# Patient Record
Sex: Female | Born: 1950
Health system: Southern US, Community
[De-identification: ages and names within clinical notes are randomized; demographics above are authoritative.]

## PROBLEM LIST (undated history)

## (undated) DIAGNOSIS — I480 Paroxysmal atrial fibrillation: Secondary | ICD-10-CM

## (undated) DIAGNOSIS — I10 Essential (primary) hypertension: Secondary | ICD-10-CM

## (undated) DIAGNOSIS — I350 Nonrheumatic aortic (valve) stenosis: Secondary | ICD-10-CM

## (undated) DIAGNOSIS — E119 Type 2 diabetes mellitus without complications: Secondary | ICD-10-CM

## (undated) DIAGNOSIS — I517 Cardiomegaly: Secondary | ICD-10-CM

## (undated) DIAGNOSIS — J449 Chronic obstructive pulmonary disease, unspecified: Secondary | ICD-10-CM

## (undated) DIAGNOSIS — I739 Peripheral vascular disease, unspecified: Secondary | ICD-10-CM

## (undated) DIAGNOSIS — I509 Heart failure, unspecified: Secondary | ICD-10-CM

## (undated) HISTORY — DX: Peripheral vascular disease, unspecified: I73.9

## (undated) HISTORY — PX: TUBAL LIGATION: SHX77

## (undated) HISTORY — DX: Paroxysmal atrial fibrillation: I48.0

## (undated) HISTORY — DX: Heart failure, unspecified: I50.9

## (undated) HISTORY — DX: Chronic obstructive pulmonary disease, unspecified: J44.9

## (undated) HISTORY — DX: Type 2 diabetes mellitus without complications: E11.9

## (undated) HISTORY — DX: Essential (primary) hypertension: I10

## (undated) HISTORY — DX: Nonrheumatic aortic (valve) stenosis: I35.0

## (undated) HISTORY — PX: TONSILLECTOMY: SUR1361

## (undated) HISTORY — DX: Cardiomegaly: I51.7

---

## 2015-06-24 DIAGNOSIS — F17200 Nicotine dependence, unspecified, uncomplicated: Secondary | ICD-10-CM | POA: Insufficient documentation

## 2015-06-24 DIAGNOSIS — IMO0001 Reserved for inherently not codable concepts without codable children: Secondary | ICD-10-CM

## 2015-06-24 DIAGNOSIS — E119 Type 2 diabetes mellitus without complications: Secondary | ICD-10-CM

## 2015-06-24 DIAGNOSIS — I4892 Unspecified atrial flutter: Secondary | ICD-10-CM | POA: Insufficient documentation

## 2015-06-24 DIAGNOSIS — I10 Essential (primary) hypertension: Secondary | ICD-10-CM | POA: Insufficient documentation

## 2015-06-24 DIAGNOSIS — I480 Paroxysmal atrial fibrillation: Secondary | ICD-10-CM | POA: Insufficient documentation

## 2015-06-24 HISTORY — DX: Essential (primary) hypertension: I10

## 2015-06-24 HISTORY — DX: Type 2 diabetes mellitus without complications: E11.9

## 2015-06-24 HISTORY — DX: Nicotine dependence, unspecified, uncomplicated: F17.200

## 2015-06-24 HISTORY — DX: Reserved for inherently not codable concepts without codable children: IMO0001

## 2015-06-24 HISTORY — DX: Unspecified atrial flutter: I48.92

## 2015-07-24 ENCOUNTER — Encounter: Payer: Self-pay | Admitting: Cardiology

## 2015-12-16 DIAGNOSIS — I739 Peripheral vascular disease, unspecified: Secondary | ICD-10-CM | POA: Insufficient documentation

## 2016-03-07 DIAGNOSIS — I350 Nonrheumatic aortic (valve) stenosis: Secondary | ICD-10-CM

## 2016-03-07 HISTORY — DX: Nonrheumatic aortic (valve) stenosis: I35.0

## 2017-04-26 ENCOUNTER — Ambulatory Visit (INDEPENDENT_AMBULATORY_CARE_PROVIDER_SITE_OTHER): Payer: BLUE CROSS/BLUE SHIELD | Admitting: Cardiology

## 2017-04-26 ENCOUNTER — Encounter: Payer: Self-pay | Admitting: Cardiology

## 2017-04-26 VITALS — BP 110/68 | HR 76 | Resp 14 | Ht 61.75 in | Wt 204.8 lb

## 2017-04-26 DIAGNOSIS — I739 Peripheral vascular disease, unspecified: Secondary | ICD-10-CM | POA: Diagnosis not present

## 2017-04-26 DIAGNOSIS — F17200 Nicotine dependence, unspecified, uncomplicated: Secondary | ICD-10-CM

## 2017-04-26 DIAGNOSIS — I350 Nonrheumatic aortic (valve) stenosis: Secondary | ICD-10-CM

## 2017-04-26 DIAGNOSIS — E119 Type 2 diabetes mellitus without complications: Secondary | ICD-10-CM

## 2017-04-26 DIAGNOSIS — I480 Paroxysmal atrial fibrillation: Secondary | ICD-10-CM

## 2017-04-26 DIAGNOSIS — I10 Essential (primary) hypertension: Secondary | ICD-10-CM | POA: Diagnosis not present

## 2017-04-26 DIAGNOSIS — IMO0001 Reserved for inherently not codable concepts without codable children: Secondary | ICD-10-CM

## 2017-04-26 NOTE — Progress Notes (Signed)
Cardiology Office Note:    Date:  04/26/2017   ID:  Makayla Leach, Makayla Leach [birth year], MRN [id number]  PCP:  Lise Auer, MD  Cardiologist:  Gypsy Balsam, MD    Referring MD: No ref. provider found   Chief Complaint  Patient presents with  . Follow-up  Paroxysmal mitral fibrillation  History of Present Illness:    Makayla Leach is a 66 y.o. female  with paroxysmal mitral fibrillation. She described some episodes keep beats but no sustained arrhythmia. Last evening she forgot to take flecainide and she felt some more palpitations. Today she took it the way she should and everything quiet down. Still does not want to take anticoagulation. Her CHADS2Vas score is equal 2. She does have exertional shortness of breath but no chest pain tightness squeezing pressure burning chest. No TIA/CVA symptoms.  Past Medical History:  Diagnosis Date  . Aortic stenosis   . CHF (congestive heart failure) (HCC)   . COPD (chronic obstructive pulmonary disease) (HCC)   . Diabetes mellitus without complication (HCC)   . Hypertension   . LAE (left atrial enlargement)   . Paroxysmal atrial fibrillation (HCC)   . Peripheral vascular disease [facility])     Past Surgical History:  Procedure Laterality Date  . TONSILLECTOMY    . TUBAL LIGATION      Current Medications: Current Meds  Medication Sig  . albuterol (ACCUNEB) 0.63 MG/3ML nebulizer solution Take 1 ampule by nebulization every 6 (six) hours as needed for wheezing.  Marland Kitchen aspirin EC 81 MG tablet Take 162 mg by mouth daily.  Marland Kitchen Co-Enzyme Q-10 30 MG CAPS Take 30 mg by mouth daily.   . empagliflozin (JARDIANCE) 25 MG TABS tablet Take 12.5 mg by mouth daily.  . flecainide (TAMBOCOR) 50 MG tablet Take 1 tablet by mouth 2 (two) times daily.  . metoprolol succinate (TOPROL-XL) 25 MG 24 hr tablet Take 25 mg by mouth 2 (two) times daily.  . Multiple Vitamin (MULTI-VITAMINS) TABS Take 1 tablet by mouth daily.  Marland Kitchen SPIRIVA RESPIMAT 2.5 MCG/ACT AERS Inhale 2  puffs into the lungs daily.     Allergies:   Patient has no known allergies.   Social History   Social History  . Marital status: Married    Spouse name: N/A  . Number of children: N/A  . Years of education: N/A   Social History Main Topics  . Smoking status: Current Every Day Smoker  . Smokeless tobacco: Never Used  . Alcohol use No  . Drug use: No  . Sexual activity: Not Asked   Other Topics Concern  . None   Social History Narrative  . None     Family History: The patient's family history includes Diabetes in her brother; Heart disease in her father and mother; Hypertension in her brother, father, and mother. ROS:   Please see the history of present illness.    All 14 point review of systems negative except as described per history of present illness  EKGs/Labs/Other Studies Reviewed:      Recent Labs: No results found for requested labs within last 8760 hours.  Recent Lipid Panel No results found for: CHOL, TRIG, HDL, CHOLHDL, VLDL, LDLCALC, LDLDIRECT  Physical Exam:    VS:  BP 110/68   Pulse 76   Resp 14   Ht 5' 1.75" (1.568 m)   Wt 204 lb 12.8 oz (92.9 kg)   BMI 37.76 kg/m     Wt Readings from Last 3 Encounters:  04/26/17 204 lb 12.8 oz (92.9 kg)     GEN:  Well nourished, well developed in no acute distress HEENT: Normal NECK: No JVD; No carotid bruits LYMPHATICS: No lymphadenopathy CARDIAC: RRR, systolin 1-2/6 ejection murumur best heaard at the upper portion of the sternum, no rubs, no gallops RESPIRATORY:  Clear to auscultation without rales, wheezing or rhonchi  ABDOMEN: Soft, non-tender, non-distended MUSCULOSKELETAL:  No edema; No deformity  SKIN: Warm and dry LOWER EXTREMITIES: no swelling NEUROLOGIC:  Alert and oriented x 3 PSYCHIATRIC:  Normal affect   ASSESSMENT:    1. Aortic stenosis, mild   2. Benign essential hypertension   3. Paroxysmal atrial fibrillation (HCC)   4. Peripheral vascular disease (HCC)   5. Type 2 diabetes  mellitus without complication, without long-term current use of insulin (HCC)   6. Smoking    PLAN:    In order of problems listed above:  1. Aortic stenosis: Only mild: I will try to retrieve her old echocardiogram. 2. Benign essential hypertension well-controlled blood pressure 110/68. Continue present management. 3. Paroxysmal atrial fibrillation: Doing well we'll continue present management including flecainide. Still refuses anticoagulation. 4. Peripheral vascular disease: We'll schedule her to have carotid ultrasound. 5. Smoking have at least 5 minutes discussion regarding that issue she said she will try nicotine chewing. 6. Dyslipidemia: We will do fasting lipid profile today  Medication Adjustments/Labs and Tests Ordered: Current medicines are reviewed at length with the patient today.  Concerns regarding medicines are outlined above.  No orders of the defined types were placed in this encounter.  Medication changes: No orders of the defined types were placed in this encounter.   Signed, Georgeanna Leaobert J. Krasowski, MD, [facility] 04/26/2017 4:21 PM    [facility]

## 2017-04-26 NOTE — Patient Instructions (Addendum)
Medication Instructions:  Your physician recommends that you continue on your current medications as directed. Please refer to the Current Medication list given to you today.  Labwork: None   Testing/Procedures: None   Follow-Up: Your physician recommends that you schedule a follow-up appointment in: 6 months   Any Other Special Instructions Will Be Listed Below (If Applicable).  Please note that any paperwork needing to be filled out by the provider will need to be addressed at the front desk prior to seeing the provider. Please note that any paperwork FMLA, Disability or other documents regarding health condition is subject to a $25.00 charge that must be received prior to completion of paperwork. `   Fat and Cholesterol Restricted Diet Getting too much fat and cholesterol in your diet may cause health problems. Following this diet helps keep your fat and cholesterol at normal levels. This can keep you from getting sick. What types of fat should I choose?  Choose monosaturated and polyunsaturated fats. These are found in foods such as olive oil, canola oil, flaxseeds, walnuts, almonds, and seeds.  Eat more omega-3 fats. Good choices include salmon, mackerel, sardines, tuna, flaxseed oil, and ground flaxseeds.  Limit saturated fats. These are in animal products such as meats, butter, and cream. They can also be in plant products such as palm oil, palm kernel oil, and coconut oil.  Avoid foods with partially hydrogenated oils in them. These contain trans fats. Examples of foods that have trans fats are stick margarine, some tub margarines, cookies, crackers, and other baked goods. What general guidelines do I need to follow?  Check food labels. Look for the words "trans fat" and "saturated fat."  When preparing a meal: ? Fill half of your plate with vegetables and green salads. ? Fill one fourth of your plate with whole grains. Look for the word "whole" as the first word in the  ingredient list. ? Fill one fourth of your plate with lean protein foods.  Eat more foods that have fiber, like apples, carrots, beans, peas, and barley.  Eat more home-cooked foods. Eat less at restaurants and buffets.  Limit or avoid alcohol.  Limit foods high in starch and sugar.  Limit fried foods.  Cook foods without frying them. Baking, boiling, grilling, and broiling are all great options.  Lose weight if you are overweight. Losing even a small amount of weight can help your overall health. It can also help prevent diseases such as diabetes and heart disease. What foods can I eat? Grains Whole grains, such as whole wheat or whole grain breads, crackers, cereals, and pasta. Unsweetened oatmeal, bulgur, barley, quinoa, or brown rice. Corn or whole wheat flour tortillas. Vegetables Fresh or frozen vegetables (raw, steamed, roasted, or grilled). Green salads. Fruits All fresh, canned (in natural juice), or frozen fruits. Meat and Other Protein Products Ground beef (85% or leaner), grass-fed beef, or beef trimmed of fat. Skinless chicken or [place]. Ground chicken or [place]. Pork trimmed of fat. All fish and seafood. Eggs. Dried beans, peas, or lentils. Unsalted nuts or seeds. Unsalted canned or dry beans. Dairy Low-fat dairy products, such as skim or 1% milk, 2% or reduced-fat cheeses, low-fat ricotta or cottage cheese, or plain low-fat yogurt. Fats and Oils Tub margarines without trans fats. Light or reduced-fat mayonnaise and salad dressings. Avocado. Olive, canola, sesame, or safflower oils. Natural peanut or almond butter (choose ones without added sugar and oil). The items listed above may not be a complete list of recommended foods  or beverages. Contact your dietitian for more options. What foods are not recommended? Grains White bread. White pasta. White rice. Cornbread. Bagels, pastries, and croissants. Crackers that contain trans fat. Vegetables White potatoes. Corn.  Creamed or fried vegetables. Vegetables in a cheese sauce. Fruits Dried fruits. Canned fruit in light or heavy syrup. Fruit juice. Meat and Other Protein Products Fatty cuts of meat. Ribs, chicken wings, bacon, sausage, bologna, salami, chitterlings, fatback, hot dogs, bratwurst, and packaged luncheon meats. Liver and organ meats. Dairy Whole or 2% milk, cream, half-and-half, and cream cheese. Whole milk cheeses. Whole-fat or sweetened yogurt. Full-fat cheeses. Nondairy creamers and whipped toppings. Processed cheese, cheese spreads, or cheese curds. Sweets and Desserts Corn syrup, sugars, honey, and molasses. Candy. Jam and jelly. Syrup. Sweetened cereals. Cookies, pies, cakes, donuts, muffins, and ice cream. Fats and Oils Butter, stick margarine, lard, shortening, ghee, or bacon fat. Coconut, palm kernel, or palm oils. Beverages Alcohol. Sweetened drinks (such as sodas, lemonade, and fruit drinks or punches). The items listed above may not be a complete list of foods and beverages to avoid. Contact your dietitian for more information. This information is not intended to replace advice given to you by your health care provider. Make sure you discuss any questions you have with your health care provider. Document Released: 03/08/2012 Document Revised: 05/14/2016 Document Reviewed: 12/07/2013 Elsevier Interactive Patient Education  [employer].   If you need a refill on your cardiac medications before your next appointment, please call your pharmacy.

## 2017-04-27 LAB — LIPID PANEL
CHOL/HDL RATIO: 5.6 ratio — AB (ref 0.0–4.4)
CHOLESTEROL TOTAL: 180 mg/dL (ref 100–199)
HDL: 32 mg/dL — ABNORMAL LOW (ref >39)
LDL Calculated: 120 mg/dL — ABNORMAL HIGH (ref 0–99)
TRIGLYCERIDES: 139 mg/dL (ref 0–149)
VLDL Cholesterol Cal: 28 mg/dL (ref 5–40)

## 2017-04-27 MED ORDER — FLECAINIDE ACETATE 50 MG PO TABS: 50.0000 mg | ORAL_TABLET | Freq: Two times a day (BID) | ORAL | 11 refills | Status: DC

## 2017-04-27 MED ORDER — METOPROLOL SUCCINATE ER 25 MG PO TB24: 25.0000 mg | ORAL_TABLET | Freq: Two times a day (BID) | ORAL | 11 refills | Status: DC

## 2017-04-27 NOTE — Addendum Note (Signed)
Addended by: Rodney LangtonITTER, JADA M on: 04/27/2017 03:25 PM   Modules accepted: Orders

## 2017-04-28 ENCOUNTER — Telehealth: Payer: Self-pay

## 2017-04-28 MED ORDER — EZETIMIBE 10 MG PO TABS: 10.0000 mg | ORAL_TABLET | Freq: Every day | ORAL | 3 refills | Status: DC

## 2017-04-28 NOTE — Telephone Encounter (Signed)
-----   Message from Georgeanna Leaobert J Krasowski, MD sent at 04/28/2017 11:37 AM EDT ----- Try zetia 10 mg po qd

## 2017-05-03 NOTE — Addendum Note (Signed)
Addended by: Rodney LangtonITTER, JADA M on: 05/03/2017 01:14 PM   Modules accepted: Orders

## 2017-11-01 ENCOUNTER — Encounter: Payer: Self-pay | Admitting: Cardiology

## 2017-11-01 ENCOUNTER — Ambulatory Visit: Payer: BLUE CROSS/BLUE SHIELD | Admitting: Cardiology

## 2017-11-01 VITALS — BP 180/100 | HR 79 | Ht 61.75 in | Wt 197.0 lb

## 2017-11-01 DIAGNOSIS — I480 Paroxysmal atrial fibrillation: Secondary | ICD-10-CM

## 2017-11-01 DIAGNOSIS — I350 Nonrheumatic aortic (valve) stenosis: Secondary | ICD-10-CM | POA: Diagnosis not present

## 2017-11-01 DIAGNOSIS — I739 Peripheral vascular disease, unspecified: Secondary | ICD-10-CM

## 2017-11-01 DIAGNOSIS — F17200 Nicotine dependence, unspecified, uncomplicated: Secondary | ICD-10-CM

## 2017-11-01 DIAGNOSIS — I10 Essential (primary) hypertension: Secondary | ICD-10-CM | POA: Diagnosis not present

## 2017-11-01 DIAGNOSIS — E119 Type 2 diabetes mellitus without complications: Secondary | ICD-10-CM | POA: Diagnosis not present

## 2017-11-01 NOTE — Patient Instructions (Signed)
Medication Instructions:  Your physician recommends that you continue on your current medications as directed. Please refer to the Current Medication list given to you today.  Labwork: Your physician recommends that you return for lab work: Lipid and BMP, you may have these done in [place] [facility]  Testing/Procedures: Your physician has requested that you have a carotid duplex. This test is an ultrasound of the carotid arteries in your neck. It looks at blood flow through these arteries that supply the brain with blood. Allow one hour for this exam. There are no restrictions or special instructions.  Follow-Up: Your physician recommends that you schedule a follow-up appointment in: 5 months with [place] [place] [facility]   Any Other Special Instructions Will Be Listed Below (If Applicable).     If you need a refill on your cardiac medications before your next appointment, please call your pharmacy.

## 2017-11-01 NOTE — Progress Notes (Signed)
Cardiology Office Note:    Date:  11/01/2017   ID:  Makayla Leach, DOB 08-Sep-1951, MRN [id number]  PCP:  Lise AuerKhan, Jaber A, MD  Cardiologist:  Gypsy Balsamobert Krasowski, MD    Referring MD: Lise AuerKhan, Jaber A, MD   Chief Complaint  Patient presents with  . Follow-up  Well  History of Present Illness:    Makayla GrayerLinda F Leach is a 67 y.o. female with paroxysmal atrial fibrillation.  Described to have some palpitations on and off but rather short lasting.  Strep to have some cough as well as shortness of breath.  Denies have any chest pain tightness squeezing pressure burning chest.  Past Medical History:  Diagnosis Date  . Aortic stenosis   . CHF (congestive heart failure) (HCC)   . COPD (chronic obstructive pulmonary disease) (HCC)   . Diabetes mellitus without complication (HCC)   . Hypertension   . LAE (left atrial enlargement)   . Paroxysmal atrial fibrillation (HCC)   . Peripheral vascular disease [facility](HCC)     Past Surgical History:  Procedure Laterality Date  . TONSILLECTOMY    . TUBAL LIGATION      Current Medications: Current Meds  Medication Sig  . acetaminophen (TYLENOL) 500 MG tablet Take 500 mg by mouth every 8 (eight) hours as needed.  Marland Kitchen. albuterol (ACCUNEB) 0.63 MG/3ML nebulizer solution Take 1 ampule by nebulization every 6 (six) hours as needed for wheezing.  Marland Kitchen. albuterol (PROVENTIL HFA;VENTOLIN HFA) 108 (90 Base) MCG/ACT inhaler Inhale 2 puffs into the lungs every 6 (six) hours as needed for wheezing or shortness of breath.  Marland Kitchen. aspirin EC 81 MG tablet Take 162 mg by mouth daily.  . empagliflozin (JARDIANCE) 25 MG TABS tablet Take 12.5 mg by mouth daily.  . flecainide (TAMBOCOR) 50 MG tablet Take 1 tablet (50 mg total) by mouth 2 (two) times daily.  . metoprolol succinate (TOPROL-XL) 25 MG 24 hr tablet Take 1 tablet (25 mg total) by mouth 2 (two) times daily.  . Multiple Vitamin (MULTI-VITAMINS) TABS Take 1 tablet by mouth daily.  . [DISCONTINUED] Co-Enzyme Q-10 30 MG CAPS Take 30  mg by mouth daily.   . [DISCONTINUED] SPIRIVA RESPIMAT 2.5 MCG/ACT AERS Inhale 2 puffs into the lungs daily.     Allergies:   Patient has no known allergies.   Social History   Socioeconomic History  . Marital status: Married    Spouse name: None  . Number of children: None  . Years of education: None  . Highest education level: None  Social Needs  . Financial resource strain: None  . Food insecurity - worry: None  . Food insecurity - inability: None  . Transportation needs - medical: None  . Transportation needs - non-medical: None  Occupational History  . None  Tobacco Use  . Smoking status: Current Every Day Smoker  . Smokeless tobacco: Never Used  Substance and Sexual Activity  . Alcohol use: No  . Drug use: No  . Sexual activity: None  Other Topics Concern  . None  Social History Narrative  . None     Family History: The patient's family history includes Diabetes in her brother; Heart disease in her father and mother; Hypertension in her brother, father, and mother. ROS:   Please see the history of present illness.    All 14 point review of systems negative except as described per history of present illness  EKGs/Labs/Other Studies Reviewed:      Recent Labs: No results found for requested labs  within last 8760 hours.  Recent Lipid Panel    Component Value Date/Time   CHOL 180 04/26/2017 1642   TRIG 139 04/26/2017 1642   HDL 32 (L) 04/26/2017 1642   CHOLHDL 5.6 (H) 04/26/2017 1642   LDLCALC 120 (H) 04/26/2017 1642    Physical Exam:    VS:  BP (!) 180/100 (BP Location: Left Arm, Patient Position: Sitting, Cuff Size: Normal)   Pulse 79   Ht 5' 1.75" (1.568 m)   Wt 197 lb (89.4 kg)   SpO2 94%   BMI 36.32 kg/m     Wt Readings from Last 3 Encounters:  11/01/17 197 lb (89.4 kg)  04/26/17 204 lb 12.8 oz (92.9 kg)     GEN:  Well nourished, well developed in no acute distress HEENT: Normal NECK: No JVD; No carotid bruits LYMPHATICS: No  lymphadenopathy CARDIAC: RRR, soft systolic murmur grade 1/6 best heard at the right upper portion of the sternum, no rubs, no gallops RESPIRATORY:  Clear to auscultation without rales, wheezing or rhonchi  ABDOMEN: Soft, non-tender, non-distended MUSCULOSKELETAL:  No edema; No deformity  SKIN: Warm and dry LOWER EXTREMITIES: no swelling NEUROLOGIC:  Alert and oriented x 3 PSYCHIATRIC:  Normal affect   ASSESSMENT:    1. Aortic stenosis, mild   2. Benign essential hypertension   3. Paroxysmal atrial fibrillation (HCC)   4. Peripheral vascular disease (HCC)   5. Type 2 diabetes mellitus without complication, without long-term current use of insulin (HCC)   6. Smoking    PLAN:    In order of problems listed above:  1. Aortic stenosis: Only mild based on last echocardiogram 2. We will repeat her echocardiogram to reassess the situation 3. Paroxysmal atrial fibrillation:: Doing well denies have any recent palpitations.  Still refused anticoagulation.  We spent Gradle of time talking about the importance of taking anticoagulation with her chads 2 Vascor equals 3.  She still refused prefers to stay only on aspirin. 4. Peripheral vascular disease: Stent repeat her carotid ultrasounds which I will schedule her for.  If any with aspirin 5. Type 2 diabetes: Her hemoglobin A1c was 6.1 which I congratulated her for. 6. Dyslipidemia: I will recheck her fasting lipid profile and then we started talking about injectable medication to lower her cholesterol.  She was not able to tolerate statin not able to tolerate Zetia 7. Smoking: Long discussion about this she strongly recommended to quit she understands she will try to work on it   Medication Adjustments/Labs and Tests Ordered: Current medicines are reviewed at length with the patient today.  Concerns regarding medicines are outlined above.  No orders of the defined types were placed in this encounter.  Medication changes: No orders of the  defined types were placed in this encounter.   Signed, Georgeanna Lea, MD, [facility] 11/01/2017 4:22 PM    [facility]

## 2017-11-10 LAB — BASIC METABOLIC PANEL
BUN/Creatinine Ratio: 19 (ref 12–28)
BUN: 14 mg/dL (ref 8–27)
CO2: 25 mmol/L (ref 20–29)
Calcium: 9.6 mg/dL (ref 8.7–10.3)
Chloride: 102 mmol/L (ref 96–106)
Creatinine, Ser: 0.73 mg/dL (ref 0.57–1.00)
GFR calc Af Amer: 101 mL/min/{1.73_m2} (ref >59)
GFR calc non Af Amer: 87 mL/min/{1.73_m2} (ref >59)
Glucose: 125 mg/dL — ABNORMAL HIGH (ref 65–99)
Potassium: 4.5 mmol/L (ref 3.5–5.2)
Sodium: 146 mmol/L — ABNORMAL HIGH (ref 134–144)

## 2017-11-10 LAB — LIPID PANEL
CHOLESTEROL TOTAL: 168 mg/dL (ref 100–199)
Chol/HDL Ratio: 5.6 ratio — ABNORMAL HIGH (ref 0.0–4.4)
HDL: 30 mg/dL — AB (ref >39)
LDL Calculated: 120 mg/dL — ABNORMAL HIGH (ref 0–99)
TRIGLYCERIDES: 91 mg/dL (ref 0–149)
VLDL CHOLESTEROL CAL: 18 mg/dL (ref 5–40)

## 2017-11-11 ENCOUNTER — Telehealth: Payer: Self-pay

## 2017-11-11 NOTE — Telephone Encounter (Signed)
-----   Message from Georgeanna Leaobert J Krasowski, MD sent at 11/10/2017  2:05 PM EST ----- Needs statin, start lipitor 20 mg po qd  flp ast alt in 6 weeks

## 2017-11-22 ENCOUNTER — Ambulatory Visit (HOSPITAL_BASED_OUTPATIENT_CLINIC_OR_DEPARTMENT_OTHER)
Admission: RE | Admit: 2017-11-22 | Discharge: 2017-11-22 | Disposition: A | Discharge status: Home / Self Care | Payer: BLUE CROSS/BLUE SHIELD | Source: Ambulatory Visit | Attending: Cardiology | Admitting: Cardiology

## 2017-11-22 DIAGNOSIS — E119 Type 2 diabetes mellitus without complications: Secondary | ICD-10-CM | POA: Diagnosis not present

## 2017-11-22 DIAGNOSIS — I739 Peripheral vascular disease, unspecified: Secondary | ICD-10-CM | POA: Insufficient documentation

## 2017-11-22 DIAGNOSIS — I6523 Occlusion and stenosis of bilateral carotid arteries: Secondary | ICD-10-CM | POA: Diagnosis not present

## 2017-11-22 DIAGNOSIS — I480 Paroxysmal atrial fibrillation: Secondary | ICD-10-CM | POA: Diagnosis not present

## 2017-11-22 DIAGNOSIS — I10 Essential (primary) hypertension: Secondary | ICD-10-CM

## 2017-11-22 DIAGNOSIS — F17200 Nicotine dependence, unspecified, uncomplicated: Secondary | ICD-10-CM | POA: Diagnosis not present

## 2017-11-22 DIAGNOSIS — I350 Nonrheumatic aortic (valve) stenosis: Secondary | ICD-10-CM

## 2017-11-22 LAB — VAS US CAROTID
LCCADDIAS: 16 cm/s
LCCADSYS: 72 cm/s
LCCAPDIAS: 16 cm/s
LCCAPSYS: 77 cm/s
LEFT ECA DIAS: -17 cm/s
LEFT VERTEBRAL DIAS: -11 cm/s
LICADDIAS: -14 cm/s
LICADSYS: -73 cm/s
LICAPSYS: 234 cm/s
Left ICA prox dias: 57 cm/s
RCCAPSYS: 73 cm/s
RIGHT ECA DIAS: -12 cm/s
RIGHT VERTEBRAL DIAS: 11 cm/s
Right CCA prox dias: 15 cm/s
Right cca dist sys: -156 cm/s

## 2017-11-22 NOTE — Progress Notes (Signed)
Carotid duplex performed. ICA stenosis seen bilaterally. Jimmy Shaely Gadberry RDCS Naraly Fritcher, Genene ChurnJames M 11/22/2017, 10:40 AM

## 2018-01-13 ENCOUNTER — Observation Stay (HOSPITAL_COMMUNITY)
Admission: EM | Admit: 2018-01-13 | Discharge: 2018-01-15 | Disposition: A | Discharge status: Home / Self Care | Payer: BLUE CROSS/BLUE SHIELD | Attending: Internal Medicine | Admitting: Internal Medicine

## 2018-01-13 ENCOUNTER — Encounter (HOSPITAL_COMMUNITY): Payer: Self-pay | Admitting: Emergency Medicine

## 2018-01-13 ENCOUNTER — Other Ambulatory Visit: Payer: Self-pay

## 2018-01-13 DIAGNOSIS — I110 Hypertensive heart disease with heart failure: Secondary | ICD-10-CM | POA: Insufficient documentation

## 2018-01-13 DIAGNOSIS — R0902 Hypoxemia: Secondary | ICD-10-CM

## 2018-01-13 DIAGNOSIS — Z7982 Long term (current) use of aspirin: Secondary | ICD-10-CM | POA: Insufficient documentation

## 2018-01-13 DIAGNOSIS — Z7984 Long term (current) use of oral hypoglycemic drugs: Secondary | ICD-10-CM | POA: Diagnosis not present

## 2018-01-13 DIAGNOSIS — I350 Nonrheumatic aortic (valve) stenosis: Secondary | ICD-10-CM | POA: Insufficient documentation

## 2018-01-13 DIAGNOSIS — I509 Heart failure, unspecified: Secondary | ICD-10-CM | POA: Insufficient documentation

## 2018-01-13 DIAGNOSIS — I480 Paroxysmal atrial fibrillation: Secondary | ICD-10-CM | POA: Diagnosis present

## 2018-01-13 DIAGNOSIS — J449 Chronic obstructive pulmonary disease, unspecified: Secondary | ICD-10-CM | POA: Diagnosis present

## 2018-01-13 DIAGNOSIS — F17200 Nicotine dependence, unspecified, uncomplicated: Secondary | ICD-10-CM | POA: Diagnosis not present

## 2018-01-13 DIAGNOSIS — Z79899 Other long term (current) drug therapy: Secondary | ICD-10-CM | POA: Insufficient documentation

## 2018-01-13 DIAGNOSIS — E1151 Type 2 diabetes mellitus with diabetic peripheral angiopathy without gangrene: Secondary | ICD-10-CM | POA: Diagnosis not present

## 2018-01-13 DIAGNOSIS — I4892 Unspecified atrial flutter: Secondary | ICD-10-CM | POA: Diagnosis not present

## 2018-01-13 DIAGNOSIS — E119 Type 2 diabetes mellitus without complications: Secondary | ICD-10-CM

## 2018-01-13 DIAGNOSIS — I10 Essential (primary) hypertension: Secondary | ICD-10-CM | POA: Diagnosis present

## 2018-01-13 DIAGNOSIS — R040 Epistaxis: Principal | ICD-10-CM

## 2018-01-13 LAB — COMPREHENSIVE METABOLIC PANEL
ALBUMIN: 3.4 g/dL — AB (ref 3.5–5.0)
ALK PHOS: 86 U/L (ref 38–126)
ALT: 18 U/L (ref 14–54)
ANION GAP: 10 (ref 5–15)
AST: 24 U/L (ref 15–41)
BUN: 19 mg/dL (ref 6–20)
CALCIUM: 8.8 mg/dL — AB (ref 8.9–10.3)
CHLORIDE: 107 mmol/L (ref 101–111)
CO2: 27 mmol/L (ref 22–32)
Creatinine, Ser: 0.87 mg/dL (ref 0.44–1.00)
GFR calc non Af Amer: >60 mL/min (ref >60)
GLUCOSE: 143 mg/dL — AB (ref 65–99)
Potassium: 4.1 mmol/L (ref 3.5–5.1)
Sodium: 144 mmol/L (ref 135–145)
Total Bilirubin: 0.9 mg/dL (ref 0.3–1.2)
Total Protein: 6.5 g/dL (ref 6.5–8.1)

## 2018-01-13 LAB — CBC
HCT: 54.2 % — ABNORMAL HIGH (ref 36.0–46.0)
HEMOGLOBIN: 18.3 g/dL — AB (ref 12.0–15.0)
MCH: 30.9 pg (ref 26.0–34.0)
MCHC: 33.8 g/dL (ref 30.0–36.0)
MCV: 91.4 fL (ref 78.0–100.0)
PLATELETS: 177 10*3/uL (ref 150–400)
RBC: 5.93 MIL/uL — AB (ref 3.87–5.11)
RDW: 14.4 % (ref 11.5–15.5)
WBC: 14.1 10*3/uL — ABNORMAL HIGH (ref 4.0–10.5)

## 2018-01-13 LAB — TYPE AND SCREEN
ABO/RH(D): O POS
ANTIBODY SCREEN: NEGATIVE

## 2018-01-13 LAB — ABO/RH: ABO/RH(D): O POS

## 2018-01-13 MED ORDER — METOPROLOL SUCCINATE ER 25 MG PO TB24: 25.0000 mg | ORAL_TABLET | Freq: Every day | ORAL | Status: DC

## 2018-01-13 MED ORDER — TETANUS-DIPHTH-ACELL PERTUSSIS 5-2.5-18.5 LF-MCG/0.5 IM SUSP: 0.5000 mL | Freq: Once | INTRAMUSCULAR | Status: DC

## 2018-01-13 MED ORDER — LABETALOL HCL 5 MG/ML IV SOLN
5.0000 mg | Freq: Once | INTRAVENOUS | Status: AC
  Administered 2018-01-13: 5 mg via INTRAVENOUS
  Filled 2018-01-13: qty 4

## 2018-01-13 MED ORDER — TRANEXAMIC ACID 1000 MG/10ML IV SOLN
500.0000 mg | Freq: Once | INTRAVENOUS | Status: AC
  Administered 2018-01-14: 500 mg via TOPICAL
  Filled 2018-01-13: qty 10

## 2018-01-13 NOTE — ED Notes (Signed)
ED Provider at bedside. 

## 2018-01-13 NOTE — ED Triage Notes (Signed)
Pt reports nosebleed off/on X1 day, pt was already seen at another facility and rhino rocket was placed. Nose started bleeding out other nostril and has not stopped since.

## 2018-01-13 NOTE — ED Provider Notes (Addendum)
[facility] Provider Note   CSN: [id number] Arrival date & time: 01/13/18  2023     History   Chief Complaint Chief Complaint  Patient presents with  . Epistaxis    HPI Makayla Leach is a 67 y.o. female who presents with an epistaxis. PMH significant for aortic stenosis, CHF, COPD, DM, PAF not on anticoagulation. She does take daily ASA. She states that her nose started bleeding this morning out of the left nares. It was profuse and she had trouble controlling the bleeding. [facility] and they inserted nasal packing. This did control the bleeding and she was discharged. She went home and started having bleeding come out her right nostril and down her throat. She has not had any SOB. She has never had this problem before. Family was concerned so she was transported by EMS to the ED here.  HPI  Past Medical History:  Diagnosis Date  . Aortic stenosis   . CHF (congestive heart failure) (HCC)   . COPD (chronic obstructive pulmonary disease) (HCC)   . Diabetes mellitus without complication (HCC)   . Hypertension   . LAE (left atrial enlargement)   . Paroxysmal atrial fibrillation (HCC)   . Peripheral vascular disease [facility](HCC)     Patient Active Problem List   Diagnosis Date Noted  . Aortic stenosis, mild 03/07/2016  . Peripheral vascular disease (HCC) 12/16/2015  . Benign essential hypertension 06/24/2015  . Paroxysmal atrial fibrillation (HCC) 06/24/2015  . Paroxysmal atrial flutter (HCC) 06/24/2015  . Smoking 06/24/2015  . Type 2 diabetes mellitus without complication (HCC) 06/24/2015    Past Surgical History:  Procedure Laterality Date  . TONSILLECTOMY    . TUBAL LIGATION       OB History   None      Home Medications    Prior to Admission medications   Medication Sig Start Date End Date Taking? Authorizing Provider  acetaminophen (TYLENOL) 500 MG tablet Take 500 mg by mouth every 8 (eight) hours as  needed.    [provider]  albuterol (ACCUNEB) 0.63 MG/3ML nebulizer solution Take 1 ampule by nebulization every 6 (six) hours as needed for wheezing.    [provider]  albuterol (PROVENTIL HFA;VENTOLIN HFA) 108 (90 Base) MCG/ACT inhaler Inhale 2 puffs into the lungs every 6 (six) hours as needed for wheezing or shortness of breath.    [provider]  aspirin EC 81 MG tablet Take 162 mg by mouth daily.    [provider]  empagliflozin (JARDIANCE) 25 MG TABS tablet Take 12.5 mg by mouth daily.    [provider]  flecainide (TAMBOCOR) 50 MG tablet Take 1 tablet (50 mg total) by mouth 2 (two) times daily. 04/27/17   Georgeanna LeaKrasowski, Robert J, MD  metoprolol succinate (TOPROL-XL) 25 MG 24 hr tablet Take 1 tablet (25 mg total) by mouth 2 (two) times daily. 04/27/17   Georgeanna LeaKrasowski, Robert J, MD  Multiple Vitamin (MULTI-VITAMINS) TABS Take 1 tablet by mouth daily.    [provider]    Family History Family History  Problem Relation Age of Onset  . Hypertension Mother   . Heart disease Mother   . Hypertension Father   . Heart disease Father   . Hypertension Brother   . Diabetes Brother     Social History Social History   Tobacco Use  . Smoking status: Current Every Day Smoker  . Smokeless tobacco: Never Used  Substance Use Topics  .  Alcohol use: No  . Drug use: No     Allergies   Patient has no known allergies.   Review of Systems Review of Systems  HENT: Positive for nosebleeds.   Respiratory: Negative for shortness of breath.   Hematological: Bruises/bleeds easily.  All other systems reviewed and are negative.    Physical Exam Updated Vital Signs BP (!) 196/88   Pulse (!) 101   Temp 98.2 F (36.8 C) (Oral)   Resp 14   Ht 5' 1.75" (1.568 m)   Wt 87.5 kg (193 lb)   SpO2 92%   BMI 35.59 kg/m   Physical Exam  Constitutional: She is oriented to person, place, and time. She appears well-developed and well-nourished. No  distress.  HENT:  Head: Normocephalic and atraumatic.  Nose: Epistaxis is observed.  Nasal packing in place. Large clots coming out of the nose and she is spitting up blood. There is blood in the oropharynx  Eyes: Pupils are equal, round, and reactive to light. Conjunctivae are normal. Right eye exhibits no discharge. Left eye exhibits no discharge. No scleral icterus.  Neck: Normal range of motion.  Cardiovascular: Tachycardia present.  Pulmonary/Chest: Effort normal and breath sounds normal. No respiratory distress.  Abdominal: She exhibits no distension.  Neurological: She is alert and oriented to person, place, and time.  Skin: Skin is warm and dry.  Psychiatric: She has a normal mood and affect. Her behavior is normal.  Nursing note and vitals reviewed.    ED Treatments / Results  Labs (all labs ordered are listed, but only abnormal results are displayed) Labs Reviewed  CBC - Abnormal; Notable for the following components:      Result Value   WBC 14.1 (*)    RBC 5.93 (*)    Hemoglobin 18.3 (*)    HCT 54.2 (*)    All other components within normal limits  COMPREHENSIVE METABOLIC PANEL - Abnormal; Notable for the following components:   Glucose, Bld 143 (*)    Calcium 8.8 (*)    Albumin 3.4 (*)    All other components within normal limits  TYPE AND SCREEN  ABO/RH    EKG None  Radiology No results found.  Procedures Procedures (including critical care time)  Medications Ordered in ED Medications  tranexamic acid (CYKLOKAPRON) injection 500 mg (500 mg Topical Given by Other 01/14/18 0038)  labetalol (NORMODYNE,TRANDATE) injection 5 mg (5 mg Intravenous Given 01/13/18 2330)  labetalol (NORMODYNE,TRANDATE) injection 5 mg (5 mg Intravenous Given 01/14/18 0038)     Initial Impression / Assessment and Plan / ED Course  I have reviewed the triage vital signs and the nursing notes.  Pertinent labs & imaging results that were available during my care of the patient were  reviewed by me and considered in my medical decision making (see chart for details).  67 year old female presents with epistaxis. She is markedly hypertensive - likely due to pain and not taking her BP medicine tonight. She is also mildly tachycardic. She has diffuse bleeding from the left nare. Nasal packing from outside hospital was removed. Afrin was sprayed. Unable to visualize bleeding after Afrin however it is still likely an anterior bleed since it is only bleeding out of the left side. Shared visit with Dr. Clayborne Dana. Another nasal packing was placed. This did not control the bleeding. Subsequently a posterior packing soaked in TXA was placed.   10:38 PM  Bleeding has slowed but not stopped. Will consult ENT.   Dr. Clayborne Dana  discussed with Dr. Jenne Pane. He recommends 10cm Merocel packing soaked in Afrin. This was successfully placed and she was observed for 30 minutes after and did not have any significant bleeding.   1:22 AM Nose was rebleeding after Merocel packing. Discussed with Dr. Jenne Pane. He recommends taping a drip pad under the nose and continued observation vs admission depending on what the patient would prefer. Discussed with the patient and family. They prefer admission. Spoke with Dr. Antionette Char who will admit.  Final Clinical Impressions(s) / ED Diagnoses   Final diagnoses:  Left-sided epistaxis    ED Discharge Orders    None         Bethel Born, PA-C 01/20/18 1625    Mesner, Barbara Cower, MD 01/23/18 1531

## 2018-01-14 ENCOUNTER — Observation Stay (HOSPITAL_COMMUNITY): Payer: BLUE CROSS/BLUE SHIELD

## 2018-01-14 ENCOUNTER — Encounter (HOSPITAL_COMMUNITY): Payer: Self-pay | Admitting: Family Medicine

## 2018-01-14 DIAGNOSIS — J449 Chronic obstructive pulmonary disease, unspecified: Secondary | ICD-10-CM | POA: Diagnosis present

## 2018-01-14 DIAGNOSIS — I10 Essential (primary) hypertension: Secondary | ICD-10-CM

## 2018-01-14 DIAGNOSIS — R040 Epistaxis: Principal | ICD-10-CM

## 2018-01-14 DIAGNOSIS — I480 Paroxysmal atrial fibrillation: Secondary | ICD-10-CM | POA: Diagnosis not present

## 2018-01-14 DIAGNOSIS — E119 Type 2 diabetes mellitus without complications: Secondary | ICD-10-CM

## 2018-01-14 HISTORY — DX: Epistaxis: R04.0

## 2018-01-14 LAB — PROTIME-INR
INR: 1.21
PROTHROMBIN TIME: 15.2 s (ref 11.4–15.2)

## 2018-01-14 LAB — BASIC METABOLIC PANEL
Anion gap: 9 (ref 5–15)
BUN: 34 mg/dL — ABNORMAL HIGH (ref 6–20)
CHLORIDE: 107 mmol/L (ref 101–111)
CO2: 24 mmol/L (ref 22–32)
Calcium: 8.2 mg/dL — ABNORMAL LOW (ref 8.9–10.3)
Creatinine, Ser: 0.66 mg/dL (ref 0.44–1.00)
Glucose, Bld: 160 mg/dL — ABNORMAL HIGH (ref 65–99)
Potassium: 3.5 mmol/L (ref 3.5–5.1)
SODIUM: 140 mmol/L (ref 135–145)

## 2018-01-14 LAB — CBG MONITORING, ED
GLUCOSE-CAPILLARY: 151 mg/dL — AB (ref 65–99)
GLUCOSE-CAPILLARY: 99 mg/dL (ref 65–99)
GLUCOSE-CAPILLARY: 96 mg/dL (ref 65–99)

## 2018-01-14 LAB — CBC
HCT: 47.3 % — ABNORMAL HIGH (ref 36.0–46.0)
Hemoglobin: 15.7 g/dL — ABNORMAL HIGH (ref 12.0–15.0)
MCH: 30.2 pg (ref 26.0–34.0)
MCHC: 33.2 g/dL (ref 30.0–36.0)
MCV: 91 fL (ref 78.0–100.0)
Platelets: 171 10*3/uL (ref 150–400)
RBC: 5.2 MIL/uL — AB (ref 3.87–5.11)
RDW: 14.2 % (ref 11.5–15.5)
WBC: 16.3 10*3/uL — AB (ref 4.0–10.5)

## 2018-01-14 LAB — GLUCOSE, CAPILLARY
GLUCOSE-CAPILLARY: 92 mg/dL (ref 65–99)
Glucose-Capillary: 149 mg/dL — ABNORMAL HIGH (ref 65–99)

## 2018-01-14 LAB — HIV ANTIBODY (ROUTINE TESTING W REFLEX): HIV SCREEN 4TH GENERATION: NONREACTIVE

## 2018-01-14 MED ORDER — INSULIN ASPART 100 UNIT/ML ~~LOC~~ SOLN
0.0000 [IU] | SUBCUTANEOUS | Status: DC
  Administered 2018-01-14: 2 [IU] via SUBCUTANEOUS
  Administered 2018-01-14: 1 [IU] via SUBCUTANEOUS
  Filled 2018-01-14: qty 1

## 2018-01-14 MED ORDER — ACETAMINOPHEN 650 MG RE SUPP: 650.0000 mg | Freq: Four times a day (QID) | RECTAL | Status: DC | PRN

## 2018-01-14 MED ORDER — ONDANSETRON HCL 4 MG PO TABS: 4.0000 mg | ORAL_TABLET | Freq: Four times a day (QID) | ORAL | Status: DC | PRN

## 2018-01-14 MED ORDER — ALBUTEROL SULFATE (2.5 MG/3ML) 0.083% IN NEBU: 2.5000 mg | INHALATION_SOLUTION | Freq: Four times a day (QID) | RESPIRATORY_TRACT | Status: DC | PRN

## 2018-01-14 MED ORDER — METOPROLOL SUCCINATE ER 25 MG PO TB24
25.0000 mg | ORAL_TABLET | Freq: Two times a day (BID) | ORAL | Status: DC
  Administered 2018-01-14 – 2018-01-15 (×3): 25 mg via ORAL
  Filled 2018-01-14 (×4): qty 1

## 2018-01-14 MED ORDER — HYDRALAZINE HCL 20 MG/ML IJ SOLN
10.0000 mg | INTRAMUSCULAR | Status: DC | PRN
Start: 2018-01-14 — End: 2018-01-15

## 2018-01-14 MED ORDER — SENNOSIDES-DOCUSATE SODIUM 8.6-50 MG PO TABS: 1.0000 | ORAL_TABLET | Freq: Every evening | ORAL | Status: DC | PRN

## 2018-01-14 MED ORDER — HYDROCODONE-ACETAMINOPHEN 5-325 MG PO TABS: 1.0000 | ORAL_TABLET | ORAL | Status: DC | PRN

## 2018-01-14 MED ORDER — ADULT MULTIVITAMIN W/MINERALS CH
1.0000 | ORAL_TABLET | Freq: Every day | ORAL | Status: DC
  Administered 2018-01-14 – 2018-01-15 (×2): 1 via ORAL
  Filled 2018-01-14 (×2): qty 1

## 2018-01-14 MED ORDER — ACETAMINOPHEN 325 MG PO TABS: 650.0000 mg | ORAL_TABLET | Freq: Four times a day (QID) | ORAL | Status: DC | PRN

## 2018-01-14 MED ORDER — CLINDAMYCIN HCL 300 MG PO CAPS
300.0000 mg | ORAL_CAPSULE | Freq: Three times a day (TID) | ORAL | Status: DC
  Administered 2018-01-14 – 2018-01-15 (×3): 300 mg via ORAL
  Filled 2018-01-14 (×3): qty 1

## 2018-01-14 MED ORDER — SODIUM CHLORIDE 0.9% FLUSH
3.0000 mL | Freq: Two times a day (BID) | INTRAVENOUS | Status: DC
  Administered 2018-01-14 – 2018-01-15 (×4): 3 mL via INTRAVENOUS

## 2018-01-14 MED ORDER — ONDANSETRON HCL 4 MG/2ML IJ SOLN: 4.0000 mg | Freq: Four times a day (QID) | INTRAMUSCULAR | Status: DC | PRN

## 2018-01-14 MED ORDER — FLECAINIDE ACETATE 50 MG PO TABS
50.0000 mg | ORAL_TABLET | Freq: Two times a day (BID) | ORAL | Status: DC
  Administered 2018-01-14 – 2018-01-15 (×3): 50 mg via ORAL
  Filled 2018-01-14 (×3): qty 1

## 2018-01-14 MED ORDER — LABETALOL HCL 5 MG/ML IV SOLN
5.0000 mg | Freq: Once | INTRAVENOUS | Status: AC
  Administered 2018-01-14: 5 mg via INTRAVENOUS
  Filled 2018-01-14: qty 4

## 2018-01-14 MED ORDER — SODIUM CHLORIDE 0.9 % IV SOLN: 250.0000 mL | INTRAVENOUS | Status: DC | PRN

## 2018-01-14 MED ORDER — SODIUM CHLORIDE 0.9% FLUSH: 3.0000 mL | INTRAVENOUS | Status: DC | PRN

## 2018-01-14 NOTE — ED Notes (Signed)
MD at bedside updating pt

## 2018-01-14 NOTE — Discharge Instructions (Signed)
-  Continue taking clindamycin as prescribed -Ice packs to face/nose as needed for pain and bleeding -Use nasal saline spray every 2 hours while awake in both sides of the nose -Monitor your blood pressure at home closely.  -Withhold aspirin for 5 days.  -If you must use oxygen at home, either place the nasal cannula in your mouth OR obtain a face tent. If you can get a face tent with humidification that would be ideal.    General Epistaxis Instructions 1. -Once one epistaxis episode occurs, it is common for some minor dripping to occur again over the next couple of days. If it does, use Afrin nasal spray for ACTIVE bleeding on the bleeding side and hold very firm anterior nasal pressure for 10-15 mins for ACTIVE bleeding. Put the bottle of Afrin parallel to the floor and pour medicine in nose, not spray.  2. After you have a nose bleed, you will need to take some extra measures to keep your noise moist to prevent crusting. First, do not use tissues inside your nose. Do not pick your nose. Use Ocean nasal spray or nasal spray every 2 hours while awake to moisten the nasal mucosa to decrease nasal crusting and cracking. Use this for 3 days after your nosebleed. Use nasal saline spray for congestion instead of blowing the nose. After using the nasal saline spray, there may be some old blood that comes out with the irrigation; this is normal.  3. If nose continues to bleed large amounts of bright red blood after pouring Afrin and holding firm pressure, for more than 20 minutes, then go to the ER.    Dr. Darliss RidgelAmanda Jo [facility]  [facility] Provider Office [phone]

## 2018-01-14 NOTE — ED Provider Notes (Signed)
  Physical Exam  BP (!) 207/91   Pulse 100   Temp 98.2 F (36.8 C) (Oral)   Resp 14   Ht 5' 1.75" (1.568 m)   Wt 87.5 kg (193 lb)   SpO2 92%   BMI 35.59 kg/m   Physical Exam  Constitutional: She is oriented to person, place, and time. She appears well-developed and well-nourished.  HENT:  Profuse bleeding from left nare  Cardiovascular: Tachycardia present.  Pulmonary/Chest: Effort normal and breath sounds normal. No respiratory distress.  Neurological: She is alert and oriented to person, place, and time.    ED Course/Procedures     .Epistaxis Management Date/Time: 01/14/2018 12:59 AM Performed by: Marily MemosMesner, Marquavius Scaife, MD Authorized by: Marily MemosMesner, Zaivion Kundrat, MD   Consent:    Consent obtained:  Verbal   Consent given by:  Patient   Risks discussed:  Bleeding, infection, nasal injury and pain   Alternatives discussed:  No treatment Anesthesia (see MAR for exact dosages):    Anesthesia method:  None Procedure details:    Treatment site:  L anterior   Treatment method:  Anterior pack   Treatment complexity:  Limited   Treatment episode: initial   Post-procedure details:    Assessment:  Bleeding decreased   Patient tolerance of procedure:  Tolerated well, no immediate complications .Epistaxis Management Date/Time: 01/14/2018 1:00 AM Performed by: Marily MemosMesner, Ashraf Mesta, MD Authorized by: Marily MemosMesner, Vardaan Depascale, MD   Consent:    Consent obtained:  Verbal   Consent given by:  Patient   Risks discussed:  Bleeding, infection and nasal injury   Alternatives discussed:  No treatment and delayed treatment Anesthesia (see MAR for exact dosages):    Anesthesia method:  None Procedure details:    Treatment site:  L anterior and L posterior   Treatment method:  Nasal balloon   Treatment complexity:  Limited   Treatment episode: recurring   Post-procedure details:    Assessment:  No improvement   Patient tolerance of procedure:  Tolerated well, no immediate complications .Epistaxis  Management Date/Time: 01/14/2018 1:02 AM Performed by: Marily MemosMesner, Karmah Potocki, MD Authorized by: Marily MemosMesner, Huma Imhoff, MD   Consent:    Consent obtained:  Verbal   Consent given by:  Patient   Risks discussed:  Bleeding and infection   Alternatives discussed:  No treatment Anesthesia (see MAR for exact dosages):    Anesthesia method:  None Procedure details:    Treatment site:  L anterior and L posterior   Treatment method:  Merocel sponge   Treatment complexity:  Limited   Treatment episode: recurring   Post-procedure details:    Assessment:  No improvement   Patient tolerance of procedure:  Tolerated well, no immediate complications    MDM  Patient with hypertension, tachycardia bleeding out of her left nare.  Discussed with Dr. Jenne PaneBates, ENT after having used a rapid Rhino and a posterior rapid Rhino soaked in TXA without success of stopping the bleeding.  He suggested using a 10 cm Merocel that was removed with antibiotic ointment and sprayed with Afrin afterwards.  This did seem to help quite a bit but did not stop the bleeding totally will observe for few more minutes and if not improved we will have ENT evaluate.       Jasiya Markie, Barbara CowerJason, MD 01/14/18 [phone]

## 2018-01-14 NOTE — H&P (Signed)
History and Physical    Makayla GrayerLinda F Leach UJW:[id number] DOB: December 09, 1950 DOA: 01/13/2018  PCP: Lise AuerKhan, Jaber A, MD   Patient coming from: Home  Chief Complaint: Nose bleed   HPI: Makayla Leach is a 67 y.o. female with medical history significant for paroxysmal atrial fibrillation not on anticoagulation, type 2 diabetes mellitus, hypertension, and COPD, now presenting to the emergency department for evaluation of epistaxis.  Patient reports that she been in her usual state of health until yesterday when she developed bleeding from her left nare in the evening.  She had intermittent bleeding since that time.  Bleeding seemed to be profuse the following morning and she sought evaluation [facility] where packing was placed and the bleeding seemed to stop.  Bleeding returned shortly after returning home and has been profuse.  She denies any pain, denies trauma or inciting event, and denies prior experience with similar symptoms.  She takes a daily aspirin but no anticoagulant.  She denies any other bruising or bleeding.  ED Course: Upon arrival to the ED, patient is found to be afebrile, saturating well on room air, hypertensive, and slightly tachycardic.  Chemistry panel is unremarkable and CBC is notable for leukocytosis to 14,100 and a polycythemia with hemoglobin 18.3.  Type and screen was performed, patient was given IV push of labetalol x2, and packing was placed in the left nare.  She continued to bleed and ENT was consulted.  Per recommendation of ENT, Merocel soaked in Afrin was placed.  This seemed to control the bleeding momentarily, but it soon returned and is soaking through gauze pads.  ENT was again contacted by the ED physician and recommended observation, indicating that they would reassess the patient in the morning.  Makayla Leach remains hemodynamically stable, in no apparent respiratory distress, and will be observed on the medical-surgical unit for ongoing evaluation and management  of epistaxis.  Review of Systems:  All other systems reviewed and apart from HPI, are negative.  Past Medical History:  Diagnosis Date  . Aortic stenosis   . CHF (congestive heart failure) (HCC)   . COPD (chronic obstructive pulmonary disease) (HCC)   . Diabetes mellitus without complication (HCC)   . Hypertension   . LAE (left atrial enlargement)   . Paroxysmal atrial fibrillation (HCC)   . Peripheral vascular disease [facility](HCC)     Past Surgical History:  Procedure Laterality Date  . TONSILLECTOMY    . TUBAL LIGATION       reports that she has been smoking.  She has never used smokeless tobacco. She reports that she does not drink alcohol or use drugs.  No Known Allergies  Family History  Problem Relation Age of Onset  . Hypertension Mother   . Heart disease Mother   . Hypertension Father   . Heart disease Father   . Hypertension Brother   . Diabetes Brother      Prior to Admission medications   Medication Sig Start Date End Date Taking? Authorizing Provider  acetaminophen (TYLENOL) 500 MG tablet Take 500 mg by mouth every 8 (eight) hours as needed.    [provider]  albuterol (ACCUNEB) 0.63 MG/3ML nebulizer solution Take 1 ampule by nebulization every 6 (six) hours as needed for wheezing.    [provider]  albuterol (PROVENTIL HFA;VENTOLIN HFA) 108 (90 Base) MCG/ACT inhaler Inhale 2 puffs into the lungs every 6 (six) hours as needed for wheezing or shortness of breath.    [provider]  aspirin  EC 81 MG tablet Take 162 mg by mouth daily.    [provider]  empagliflozin (JARDIANCE) 25 MG TABS tablet Take 12.5 mg by mouth daily.    [provider]  flecainide (TAMBOCOR) 50 MG tablet Take 1 tablet (50 mg total) by mouth 2 (two) times daily. 04/27/17   Georgeanna Lea, MD  metoprolol succinate (TOPROL-XL) 25 MG 24 hr tablet Take 1 tablet (25 mg total) by mouth 2 (two) times daily. 04/27/17   Georgeanna Lea, MD    Multiple Vitamin (MULTI-VITAMINS) TABS Take 1 tablet by mouth daily.    [provider]    Physical Exam: Vitals:   01/14/18 0045 01/14/18 0100 01/14/18 0115 01/14/18 0130  BP: (!) 180/81 (!) 181/76 (!) 180/75 (!) 171/73  Pulse: 95 92 93 90  Resp:   14   Temp:      TempSrc:      SpO2: 93% 90% 94% 91%  Weight:      Height:          Constitutional: NAD, calm  Eyes: PERTLA, lids and conjunctivae normal ENMT: Bleeding from left nare. Posterior pharynx clear of any exudate or lesions.   Neck: normal, supple, no masses, no thyromegaly Respiratory: Slightly diminished breath sounds bilaterally with occasional wheeze, no crackles. No accessory muscle use.  Cardiovascular: Rate ~110 and regular. No significant JVD. Abdomen: No distension, no tenderness, soft. Bowel sounds normal.  Musculoskeletal: no clubbing / cyanosis. No joint deformity upper and lower extremities.   Skin: no significant rashes, lesions, ulcers. Warm, dry, well-perfused. Neurologic: No facial asymmetry. Sensation intact. Strength 5/5 in all 4 limbs.  Psychiatric:  Alert and oriented x 3. Pleasant, cooperative.     Labs on Admission: I have personally reviewed following labs and imaging studies  CBC: Recent Labs  Lab 01/13/18 2130  WBC 14.1*  HGB 18.3*  HCT 54.2*  MCV 91.4  PLT 177   Basic Metabolic Panel: Recent Labs  Lab 01/13/18 2130  NA 144  K 4.1  CL 107  CO2 27  GLUCOSE 143*  BUN 19  CREATININE 0.87  CALCIUM 8.8*   GFR: Estimated Creatinine Clearance: 66.7 mL/min (by C-G formula based on SCr of 0.87 mg/dL). Liver Function Tests: Recent Labs  Lab 01/13/18 2130  AST 24  ALT 18  ALKPHOS 86  BILITOT 0.9  PROT 6.5  ALBUMIN 3.4*   No results for input(s): LIPASE, AMYLASE in the last 168 hours. No results for input(s): AMMONIA in the last 168 hours. Coagulation Profile: No results for input(s): INR, PROTIME in the last 168 hours. Cardiac Enzymes: No results for input(s):  CKTOTAL, CKMB, CKMBINDEX, TROPONINI in the last 168 hours. BNP (last 3 results) No results for input(s): PROBNP in the last 8760 hours. HbA1C: No results for input(s): HGBA1C in the last 72 hours. CBG: No results for input(s): GLUCAP in the last 168 hours. Lipid Profile: No results for input(s): CHOL, HDL, LDLCALC, TRIG, CHOLHDL, LDLDIRECT in the last 72 hours. Thyroid Function Tests: No results for input(s): TSH, T4TOTAL, FREET4, T3FREE, THYROIDAB in the last 72 hours. Anemia Panel: No results for input(s): VITAMINB12, FOLATE, FERRITIN, TIBC, IRON, RETICCTPCT in the last 72 hours. Urine analysis: No results found for: COLORURINE, APPEARANCEUR, LABSPEC, PHURINE, GLUCOSEU, HGBUR, BILIRUBINUR, KETONESUR, PROTEINUR, UROBILINOGEN, NITRITE, LEUKOCYTESUR Sepsis Labs: @LABRCNTIP (procalcitonin:4,lacticidven:4) )No results found for this or any previous visit (from the past 240 hour(s)).   Radiological Exams on Admission: No results found.  EKG: Not performed.   Assessment/Plan  1. Epistaxis  - Presents with one day of epistaxis, worsening  - She had packing placed at [facility] earlier in the day, continued to bleed and came into our ED where Merocel soaked in Afrin was placed - There continues to be some bleeding, ENT aware and will reassess in am  - Continue supportive care, hold ASA, repeat CBC in am    2. Paroxysmal atrial fibrillation  - In a regular rhythm on admission  - CHADS-VASc 3 (gender, DM, HTN)  - She has refused AC per cardiology notes  - Continue flecainide and metoprolol   3. Type II DM  - No A1c on file  - Managed at home with Jardiance, held on admission  - Check CBG's and use SSI with Novolog for now    4. Hypertension  - BP elevated in ED and treated with labetalol IVP x2  - Continue Toprol, use hydralazine IVP prn    DVT prophylaxis: SCD's  Code Status: Full  Family Communication: Family updated at bedside Consults called: ENT Admission  status: Observation    Briscoe Deutscher, MD [facility] Pager [phone]  If 7PM-7AM, please contact night-coverage [email or web address] Password [email or web address]  01/14/2018, 2:13 AM

## 2018-01-14 NOTE — Consult Note (Signed)
[facility] OTOLARYNGOLOGY CONSULTATION  Referring Physician:Dr. Purohat Primary Care Physician: Lise AuerKhan, Jaber A, MD Patient Location at Initial Consult: Emergency Department Chief Complaint/Reason for Consult: epistaxis  History of Presenting Illness:  History obtained from patient and family. Makayla Leach is a  67 y.o. female presenting with  Epistaxis on the left side only. This began almost 2 days ago. Has never happened in the past. She awoke suddenly with bright red nasal bleeding. Could not get it stopped at home. Went to [facility] ED where anterior pack was placed. Returned with recurrence of bleeding. Pack replaced with Merocel soaked in Afrin. Blood pressure has been very uncontrolled (>230s at times, systolic). Recent pack replaced this morning at 4am with some minimal oozing around it. Improved and resolved since BP has normalized. Denies facial pain/pressure, vision changes, nasal injuries or surgeries. Former smoker. Sometimes wears nasal cannula. Has had worsening cough recently, underwent CXR today. Minimal oozing since 4am.  Takes ~234 ASA daily for A fib.   Past Medical History:  Diagnosis Date  . Aortic stenosis   . CHF (congestive heart failure) (HCC)   . COPD (chronic obstructive pulmonary disease) (HCC)   . Diabetes mellitus without complication (HCC)   . Hypertension   . LAE (left atrial enlargement)   . Paroxysmal atrial fibrillation (HCC)   . Peripheral vascular disease [facility](HCC)     Past Surgical History:  Procedure Laterality Date  . TONSILLECTOMY    . TUBAL LIGATION      Family History  Problem Relation Age of Onset  . Hypertension Mother   . Heart disease Mother   . Hypertension Father   . Heart disease Father   . Hypertension Brother   . Diabetes Brother     Social History   Socioeconomic History  . Marital status: Married    Spouse name: Not on file  . Number of children: Not on file  . Years of education: Not on file  .  Highest education level: Not on file  Occupational History  . Not on file  Social Needs  . Financial resource strain: Not on file  . Food insecurity:    Worry: Not on file    Inability: Not on file  . Transportation needs:    Medical: Not on file    Non-medical: Not on file  Tobacco Use  . Smoking status: Current Every Day Smoker  . Smokeless tobacco: Never Used  Substance and Sexual Activity  . Alcohol use: No  . Drug use: No  . Sexual activity: Not on file  Lifestyle  . Physical activity:    Days per week: Not on file    Minutes per session: Not on file  . Stress: Not on file  Relationships  . Social connections:    Talks on phone: Not on file    Gets together: Not on file    Attends religious service: Not on file    Active member of club or organization: Not on file    Attends meetings of clubs or organizations: Not on file    Relationship status: Not on file  Other Topics Concern  . Not on file  Social History Narrative  . Not on file    No current facility-administered medications on file prior to encounter.    Current Outpatient Medications on File Prior to Encounter  Medication Sig Dispense Refill  . acetaminophen (TYLENOL) 500 MG tablet Take 500 mg by mouth every 8 (eight) hours as needed for  mild pain.     Marland Kitchen albuterol (ACCUNEB) 0.63 MG/3ML nebulizer solution Take 1 ampule by nebulization every 6 (six) hours as needed for wheezing.    Marland Kitchen albuterol (PROVENTIL HFA;VENTOLIN HFA) 108 (90 Base) MCG/ACT inhaler Inhale 2 puffs into the lungs every 6 (six) hours as needed for wheezing or shortness of breath.    Marland Kitchen amoxicillin (AMOXIL) 500 MG capsule Take 500 mg by mouth 3 (three) times daily.    Marland Kitchen aspirin EC 81 MG tablet Take 162 mg by mouth daily.    . empagliflozin (JARDIANCE) 25 MG TABS tablet Take 12.5 mg by mouth daily.    . flecainide (TAMBOCOR) 50 MG tablet Take 1 tablet (50 mg total) by mouth 2 (two) times daily. 60 tablet 11  . HYDROcodone-acetaminophen  (NORCO/VICODIN) 5-325 MG tablet Take 1 tablet by mouth every 6 (six) hours as needed for moderate pain.    . metoprolol succinate (TOPROL-XL) 25 MG 24 hr tablet Take 1 tablet (25 mg total) by mouth 2 (two) times daily. 60 tablet 11    No Known Allergies   Review of Systems: Completed and negative except for above HPI   OBJECTIVE: Vital Signs: Vitals:   01/14/18 1400 01/14/18 1528  BP: (!) 135/51 (!) 136/55  Pulse: 76 78  Resp:  16  Temp:  97.9 F (36.6 C)  SpO2: 98% 94%    I&O No intake or output data in the 24 hours ending 01/14/18 1638  Physical Exam General: Well developed, well nourished. No acute distress. Voice strong  Head/Face: Normocephalic, atraumatic. No scars or lesions. No sinus tenderness. Facial nerve intact and equal bilaterally.  No facial lacerations. Salivary glands non tender and without palpable masses  Eyes: Globes well positioned, no proptosis Lids: No periorbital edema/ecchymosis. No lid laceration Conjunctiva: No chemosis, hemorrhage PERRL EOMI  Ears: No gross deformity. Normal external canal.   Hearing:  Normal speech reception.  Nose: No gross deformity or lesions. No purulent discharge. Septum midline.   Merocel pack in left naris with dried blood, no oozing.   Mouth/Oropharynx: Lips without any lesions. Dentition poor. No mucosal lesions within the oropharynx. No tonsillar enlargement, exudate, or lesions. Pharyngeal walls symmetrical. Uvula midline. Tongue midline without lesions. No bleeding noted along posterior pharyngeal wall  Neck: Trachea midline. No masses. No thyromegaly or nodules palpated. No crepitus.  Lymphatic: No lymphadenopathy in the neck.  Respiratory: No stridor or distress. No dysphonia On room air.   Cardiovascular: Regular rate and rhythm.  Extremities: No edema or cyanosis. Warm and well-perfused.  Skin: No scars or lesions on face or neck.  Neurologic: CN II-XII intact. Moving all extremities without gross abnormality.   Other:      Labs: Lab Results  Component Value Date   WBC 16.3 (H) 01/14/2018   HGB 15.7 (H) 01/14/2018   HCT 47.3 (H) 01/14/2018   PLT 171 01/14/2018   CHOL 168 11/09/2017   TRIG 91 11/09/2017   HDL 30 (L) 11/09/2017   ALT 18 01/13/2018   AST 24 01/13/2018   NA 140 01/14/2018   K 3.5 01/14/2018   CL 107 01/14/2018   CREATININE 0.66 01/14/2018   BUN 34 (H) 01/14/2018   CO2 24 01/14/2018   INR 1.21 01/14/2018     Review of Ancillary Data / Diagnostic Tests: Hgb >15   ASSESSMENT:  67 y.o. female with left-sided epistaxis, now resolved with tight BP control and Merocel pope pack in left naris.   RECOMMENDATIONS: -Maintain very tight BP control  at home -Nasal saline spray to both sides of the nose every 2 hours while awake -Hold ASA if medically appropriate for 5 days.  -Agree with Clindamycin while pack is in place -Afrin for bright larger volume bleeding only -Hold firm nasal pressure if a re-bleed occurs.  -OK for DC home from my perspective with close BP monitoring and followup  Follow up with Dr. Doran Heater in 4-5 days for pack removal    Misty Stanley, MD  [facility] Office phone [phone]

## 2018-01-14 NOTE — ED Notes (Signed)
Patient CBG was 99. 

## 2018-01-14 NOTE — ED Notes (Signed)
MD to come see pt shortly. Pt updated.

## 2018-01-14 NOTE — ED Notes (Signed)
Pt placed on 2L [place] orally

## 2018-01-14 NOTE — Progress Notes (Addendum)
PROGRESS NOTE    Makayla Leach  ZOX:[id number] DOB: 1951/03/24 DOA: 01/13/2018 PCP: Lise Auer, MD   Brief Narrative:  67 year old with past medical history relevant for COPD, type 2 diabetes on oral hypoglycemics, paroxysmal atrial fibrillation not on anticoagulation, hypertension who came in with recalcitrant left-sided epistaxis.   Assessment & Plan:   Principal Problem:   Left-sided epistaxis Active Problems:   Benign essential hypertension   Paroxysmal atrial fibrillation (HCC)   Type 2 diabetes mellitus without complication (HCC)   COPD (chronic obstructive pulmonary disease) (HCC)   #) Left-sided epistaxis: Apparently this is been quite difficult to stop and she is required multiple interventions.  She currently does not have any frank bleeding however reports continued oozing.  She is only on aspirin at home A. fib. - N.p.o. currently -ENT consult - Clindamycin prophylaxis -Chest x-ray shows no evidence of aspiration but only bibasilar atelectasis and possible very mild CHF  #) Paroxysmal atrial fibrillation: -Holding home aspirin - Continue flecainide 50 mg twice daily -Continue metoprolol succinate 25 mg twice daily  #) Type 2 diabetes: -Hold home empagliflozin -Sliding scale insulin, AC at bedtime  History) hypertension: -Continue metoprolol succinate 25 mg twice daily  Fluids: Tolerating p.o. Letter lites: Monitor and supplement Nutrition: N.p.o. pending ENT recommendations  Prophylaxis: SCDs  Disposition: Pending resolution of significant bleeding  Full code    Consultants:   ENT  Procedures: (Don't include imaging studies which can be auto populated. Include things that cannot be auto populated i.e. Echo, Carotid and venous dopplers, Foley, Bipap, HD, tubes/drains, wound vac, central lines etc)  None  Antimicrobials: (specify start and planned stop date. Auto populated tables are space occupying and do not give end dates)  Minimized and  started 01/14/2018   Subjective: Patient reports she has not had any current bleeding but has not felt any swallowed blood at this time.  She otherwise denies any fevers, nausea, vomiting, diarrhea.  She does not have any shortness of breath or difficulty breathing.  Objective: Vitals:   01/14/18 0915 01/14/18 1045 01/14/18 1100 01/14/18 1130  BP: (!) 140/53 (!) 129/56 (!) 144/47 (!) 138/47  Pulse: 72 75 73 76  Resp:      Temp:      TempSrc:      SpO2: 98% 96% 98% 97%  Weight:      Height:       No intake or output data in the 24 hours ending 01/14/18 1259 Filed Weights   01/13/18 2047  Weight: 87.5 kg (193 lb)    Examination:  General exam: Uncomfortable but no acute distress Respiratory system: Diminished lung sounds at bases, no increased work of breathing, no wheezes, rhonchi, crackles Cardiovascular system: Regular rate and rhythm, no murmurs Gastrointestinal system: Soft, nondistended, plus bowel sounds, no rebound or guarding Central nervous system: Alert and oriented. No focal neurological deficits. Extremities: Lower extremity edema Skin: Left nares is completely packed with several pieces of gauze that are soaked through Psychiatry: Judgement and insight appear normal. Mood & affect appropriate.     Data Reviewed: I have personally reviewed following labs and imaging studies  CBC: Recent Labs  Lab 01/13/18 2130 01/14/18 0320  WBC 14.1* 16.3*  HGB 18.3* 15.7*  HCT 54.2* 47.3*  MCV 91.4 91.0  PLT 177 171   Basic Metabolic Panel: Recent Labs  Lab 01/13/18 2130 01/14/18 0320  NA 144 140  K 4.1 3.5  CL 107 107  CO2 27 24  GLUCOSE 143* 160*  BUN 19 34*  CREATININE 0.87 0.66  CALCIUM 8.8* 8.2*   GFR: Estimated Creatinine Clearance: 72.6 mL/min (by C-G formula based on SCr of 0.66 mg/dL). Liver Function Tests: Recent Labs  Lab 01/13/18 2130  AST 24  ALT 18  ALKPHOS 86  BILITOT 0.9  PROT 6.5  ALBUMIN 3.4*   No results for input(s): LIPASE,  AMYLASE in the last 168 hours. No results for input(s): AMMONIA in the last 168 hours. Coagulation Profile: Recent Labs  Lab 01/14/18 0320  INR 1.21   Cardiac Enzymes: No results for input(s): CKTOTAL, CKMB, CKMBINDEX, TROPONINI in the last 168 hours. BNP (last 3 results) No results for input(s): PROBNP in the last 8760 hours. HbA1C: No results for input(s): HGBA1C in the last 72 hours. CBG: Recent Labs  Lab 01/14/18 0352 01/14/18 0733 01/14/18 1138  GLUCAP 151* 99 96   Lipid Profile: No results for input(s): CHOL, HDL, LDLCALC, TRIG, CHOLHDL, LDLDIRECT in the last 72 hours. Thyroid Function Tests: No results for input(s): TSH, T4TOTAL, FREET4, T3FREE, THYROIDAB in the last 72 hours. Anemia Panel: No results for input(s): VITAMINB12, FOLATE, FERRITIN, TIBC, IRON, RETICCTPCT in the last 72 hours. Sepsis Labs: No results for input(s): PROCALCITON, LATICACIDVEN in the last 168 hours.  No results found for this or any previous visit (from the past 240 hour(s)).       Radiology Studies: Dg Chest Port 1 View  Result Date: 01/14/2018 CLINICAL DATA:  Nose bleed.  Hypoxia. EXAM: PORTABLE CHEST 1 VIEW COMPARISON:  06/02/2015. FINDINGS: Cardiomegaly with very mild interstitial prominence. Low lung volumes. No pleural effusion. No pneumothorax. No acute bony abnormality. IMPRESSION: Cardiomegaly. Very mild increase interstitial markings compared to prior study. Very mild CHF cannot be entirely excluded. 2.  Low lung volumes with mild basilar atelectasis. Electronically Signed   By: Maisie Fushomas  Register   On: 01/14/2018 09:02        Scheduled Meds: . flecainide  50 mg Oral BID  . insulin aspart  0-9 Units Subcutaneous Q4H  . metoprolol succinate  25 mg Oral BID  . multivitamin with minerals  1 tablet Oral Daily  . sodium chloride flush  3 mL Intravenous Q12H   Continuous Infusions: . sodium chloride       LOS: 0 days    Time spent: 35    Delaine LameShrey C Anshika Pethtel, MD [facility]   If 7PM-7AM, please contact night-coverage [email or web address] Password [email or web address] 01/14/2018, 12:59 PM

## 2018-01-14 NOTE — ED Notes (Signed)
Pt states she would like to be updated on the plan of care from ENT. MD paged.

## 2018-01-14 NOTE — ED Notes (Signed)
Patient is NPO at this time. No Diet was ordered at this time.

## 2018-01-14 NOTE — ED Notes (Signed)
Portable x-ray at the bedside.  

## 2018-01-14 NOTE — ED Notes (Signed)
Family remains at bedside.

## 2018-01-15 DIAGNOSIS — R040 Epistaxis: Secondary | ICD-10-CM | POA: Diagnosis not present

## 2018-01-15 LAB — BASIC METABOLIC PANEL
Anion gap: 5 (ref 5–15)
CO2: 27 mmol/L (ref 22–32)
Chloride: 109 mmol/L (ref 101–111)
GFR calc Af Amer: >60 mL/min (ref >60)
Glucose, Bld: 99 mg/dL (ref 65–99)
Potassium: 3.9 mmol/L (ref 3.5–5.1)
Sodium: 141 mmol/L (ref 135–145)

## 2018-01-15 LAB — GLUCOSE, CAPILLARY
GLUCOSE-CAPILLARY: 86 mg/dL (ref 65–99)
GLUCOSE-CAPILLARY: 96 mg/dL (ref 65–99)
Glucose-Capillary: 105 mg/dL — ABNORMAL HIGH (ref 65–99)

## 2018-01-15 LAB — MAGNESIUM: Magnesium: 1.8 mg/dL (ref 1.7–2.4)

## 2018-01-15 LAB — CBC
HCT: 43.5 % (ref 36.0–46.0)
Hemoglobin: 14.1 g/dL (ref 12.0–15.0)
MCH: 29.7 pg (ref 26.0–34.0)
MCHC: 32.4 g/dL (ref 30.0–36.0)
MCV: 91.6 fL (ref 78.0–100.0)
Platelets: 162 K/uL (ref 150–400)
RBC: 4.75 MIL/uL (ref 3.87–5.11)
RDW: 14.6 % (ref 11.5–15.5)
WBC: 9.6 10*3/uL (ref 4.0–10.5)

## 2018-01-15 LAB — BASIC METABOLIC PANEL WITH GFR
BUN: 20 mg/dL (ref 6–20)
Calcium: 8.4 mg/dL — ABNORMAL LOW (ref 8.9–10.3)
Creatinine, Ser: 0.59 mg/dL (ref 0.44–1.00)
GFR calc non Af Amer: >60 mL/min (ref >60)

## 2018-01-15 MED ORDER — CLINDAMYCIN HCL 300 MG PO CAPS: 300.0000 mg | ORAL_CAPSULE | Freq: Three times a day (TID) | ORAL | 0 refills | Status: DC

## 2018-01-15 MED ORDER — OXYMETAZOLINE HCL 0.05 % NA SOLN: 3.0000 | Freq: Two times a day (BID) | NASAL | 2 refills | Status: AC | PRN

## 2018-01-15 MED ORDER — SALINE SPRAY 0.65 % NA SOLN: 1.0000 | NASAL | 8 refills | Status: DC | PRN

## 2018-01-15 NOTE — Progress Notes (Signed)
Pt discharged home in stable condition after going over discharge instructions with no concerns voiced. AVS and discharge scripts given before leaving unit. Left nare packing in place, pt to have that removed as an outpatient by ENT MD

## 2018-01-15 NOTE — Discharge Summary (Signed)
Physician Discharge Summary  Makayla Leach ZOX:[id number] DOB: 1951/06/10 DOA: 01/13/2018  PCP: Lise Auer, MD  Admit date: 01/13/2018 Discharge date: 01/15/2018  Admitted From: Home Disposition: Home  Recommendations for Outpatient Follow-up:  1. Follow up with PCP in 1-2 weeks 2. Please obtain BMP/CBC in one week 3. Please follow up with ENT for your nosebleed. 4. Please hold your aspirin until you follow-up with ENT doctor.  Home Health: No Equipment/Devices: No  Discharge Condition: Stable CODE STATUS: Full Diet recommendation: Heart Healthy  Brief/Interim Summary:  #) Recalcitrant left nares bleed: Patient was admitted with nosebleed that would not stop from the left nares.  She had Afrin-soaked Merocel placed by the emergency department was admitted to the floor.  She was seen by ear nose and throat who evaluated her and recommended continued packing and every 2 hours saline spray and Afrin for bleeding.  Patient was told to tightly control blood pressure as this was felt with most likely cause for bleeding.  Her aspirin was held and will be held until she sees ENT on 01/18/2018.  Chest x-ray showed only low lung volumes with no evidence of aspiration.  She was started on clindamycin prophylaxis and will continue this as an outpatient.  #) Paroxysmal atrial fibrillation: Patient was told to hold aspirin.  She was continued on her home flecainide and metoprolol succinate 25 mg twice daily.  #) His type 2 diabetes: Patient was maintained on sliding scale here and was told to restart her home SGLT2 inhibitor.  #)hypertension: Patient was continued on home metoprolol.  #) COPD: Patient was maintained on PRN albuterol.  She will have outpatient follow-up for her COPD.  She has nighttime and as needed oxygen as needed at home. Discharge Diagnoses:  Principal Problem:   Left-sided epistaxis Active Problems:   Benign essential hypertension   Paroxysmal atrial fibrillation (HCC)   Type 2 diabetes mellitus without complication (HCC)   COPD (chronic obstructive pulmonary disease) (HCC)    Discharge Instructions  Discharge Instructions    Call MD for:  persistant nausea and vomiting   Complete by:  As directed    Call MD for:  redness, tenderness, or signs of infection (pain, swelling, redness, odor or green/yellow discharge around incision site)   Complete by:  As directed    Call MD for:  severe uncontrolled pain   Complete by:  As directed    Call MD for:  temperature >100.4   Complete by:  As directed    Diet - low sodium heart healthy   Complete by:  As directed    Discharge instructions   Complete by:  As directed    Please use saline spray every 2 hours in nose while awake. Use afrin for any bleeding.   Increase activity slowly   Complete by:  As directed      Allergies as of 01/15/2018   No Known Allergies     Medication List    STOP taking these medications   amoxicillin 500 MG capsule Commonly known as:  AMOXIL   aspirin EC 81 MG tablet     TAKE these medications   acetaminophen 500 MG tablet Commonly known as:  TYLENOL Take 500 mg by mouth every 8 (eight) hours as needed for mild pain.   albuterol 0.63 MG/3ML nebulizer solution Commonly known as:  ACCUNEB Take 1 ampule by nebulization every 6 (six) hours as needed for wheezing.   albuterol 108 (90 Base) MCG/ACT inhaler Commonly known as:  PROVENTIL HFA;VENTOLIN HFA Inhale 2 puffs into the lungs every 6 (six) hours as needed for wheezing or shortness of breath.   clindamycin 300 MG capsule Commonly known as:  CLEOCIN Take 1 capsule (300 mg total) by mouth every 8 (eight) hours.   empagliflozin 25 MG Tabs tablet Commonly known as:  JARDIANCE Take 12.5 mg by mouth daily.   flecainide 50 MG tablet Commonly known as:  TAMBOCOR Take 1 tablet (50 mg total) by mouth 2 (two) times daily.   HYDROcodone-acetaminophen 5-325 MG tablet Commonly known as:  NORCO/VICODIN Take 1 tablet  by mouth every 6 (six) hours as needed for moderate pain.   metoprolol succinate 25 MG 24 hr tablet Commonly known as:  TOPROL-XL Take 1 tablet (25 mg total) by mouth 2 (two) times daily.   oxymetazoline 0.05 % nasal spray Commonly known as:  AFRIN Place 3 sprays into both nostrils 2 (two) times daily as needed for congestion.   sodium chloride 0.65 % Soln nasal spray Commonly known as:  OCEAN Place 1 spray into both nostrils as needed for congestion.      Follow-up Information    Lise Auer, MD.   Specialty:  [facility] Medicine Contact information: [address] [place] [address] [phone]        Graylin Shiver, MD. Schedule an appointment as soon as possible for a visit.   Specialty:  Otolaryngology Why:  Tuesday afternoon or Wednesday morning for packing removal Contact information: [address] [place] [address] [place] [address] [phone]          No Known Allergies  Consultations:  [facility]NT   Procedures/Studies: Dg Chest Port 1 View  Result Date: 01/14/2018 CLINICAL DATA:  Nose bleed.  Hypoxia. EXAM: PORTABLE CHEST 1 VIEW COMPARISON:  06/02/2015. FINDINGS: Cardiomegaly with very mild interstitial prominence. Low lung volumes. No pleural effusion. No pneumothorax. No acute bony abnormality. IMPRESSION: Cardiomegaly. Very mild increase interstitial markings compared to prior study. Very mild CHF cannot be entirely excluded. 2.  Low lung volumes with mild basilar atelectasis. Electronically Signed   By: Maisie Fus  Register   On: 01/14/2018 09:02      Subjective:   Discharge Exam: Vitals:   01/14/18 2015 01/15/18 0622  BP: (!) 140/55 (!) 157/57  Pulse: 81 83  Resp:  16  Temp: 98.4 F (36.9 C) (!) 97.5 F (36.4 C)  SpO2: 90% 91%   Vitals:   01/14/18 1528 01/14/18 1929 01/14/18 2015 01/15/18 0622  BP: (!) 136/55 (!) 137/53 (!) 140/55 (!) 157/57  Pulse: 78 80 81 83  Resp: Temp: 97.9 F (36.6 C) 98.3 F (36.8  C) 98.4 F (36.9 C) (!) 97.5 F (36.4 C)  TempSrc: Oral Oral Oral Oral  SpO2: 94% 93% 90% 91%  Weight: 87.5 kg (193 lb)     Height:  (1.575 m)       General: Pt is alert, awake, not in acute distress, left nares nasal packing is bloody but in place with no overt bleeding Cardiovascular: RRR, S1/S2 +, no rubs, no gallops Respiratory: CTA bilaterally, no wheezing, scattered rhonchi Abdominal: Soft, NT, ND, bowel sounds + Extremities: no edema    The results of significant diagnostics from this hospitalization (including imaging, microbiology, ancillary and laboratory) are listed below for reference.     Microbiology: No results found for this or any previous visit (from the past 240 hour(s)).   Labs: BNP (last 3 results) No results for  input(s): BNP in the last 8760 hours. Basic Metabolic Panel: Recent Labs  Lab 01/13/18 2130 01/14/18 0320 01/15/18 0519  NA 144 140 141  K 4.1 3.5 3.9  CL 107 107 109  CO2 GLUCOSE 143* 160* 99  BUN 19 34* 20  CREATININE 0.87 0.66 0.59  CALCIUM 8.8* 8.2* 8.4*  MG  --   --  1.8   Liver Function Tests: Recent Labs  Lab 01/13/18 2130  AST 24  ALT 18  ALKPHOS 86  BILITOT 0.9  PROT 6.5  ALBUMIN 3.4*   No results for input(s): LIPASE, AMYLASE in the last 168 hours. No results for input(s): AMMONIA in the last 168 hours. CBC: Recent Labs  Lab 01/13/18 2130 01/14/18 0320 01/15/18 0519  WBC 14.1* 16.3* 9.6  HGB 18.3* 15.7* 14.1  HCT 54.2* 47.3* 43.5  MCV 91.4 91.0 91.6  PLT 177 171 162   Cardiac Enzymes: No results for input(s): CKTOTAL, CKMB, CKMBINDEX, TROPONINI in the last 168 hours. BNP: Invalid input(s): POCBNP CBG: Recent Labs  Lab 01/14/18 1652 01/14/18 2009 01/15/18 0027 01/15/18 0348 01/15/18 0746  GLUCAP 92 149* 86 105* 96   D-Dimer No results for input(s): DDIMER in the last 72 hours. Hgb A1c No results for input(s): HGBA1C in the last 72 hours. Lipid Profile No results for input(s):  CHOL, HDL, LDLCALC, TRIG, CHOLHDL, LDLDIRECT in the last 72 hours. Thyroid function studies No results for input(s): TSH, T4TOTAL, T3FREE, THYROIDAB in the last 72 hours.  Invalid input(s): FREET3 Anemia work up No results for input(s): VITAMINB12, FOLATE, FERRITIN, TIBC, IRON, RETICCTPCT in the last 72 hours. Urinalysis No results found for: COLORURINE, APPEARANCEUR, LABSPEC, PHURINE, GLUCOSEU, HGBUR, BILIRUBINUR, KETONESUR, PROTEINUR, UROBILINOGEN, NITRITE, LEUKOCYTESUR Sepsis Labs Invalid input(s): PROCALCITONIN,  WBC,  LACTICIDVEN Microbiology No results found for this or any previous visit (from the past 240 hour(s)).   Time coordinating discharge: Over 30 minutes  SIGNED:   Delaine Lame, MD  [facility] 01/15/2018, 11:07 AM   If 7PM-7AM, please contact night-coverage [email or web address] Password [email or web address]

## 2018-05-04 ENCOUNTER — Other Ambulatory Visit: Payer: Self-pay

## 2018-05-04 MED ORDER — FLECAINIDE ACETATE 50 MG PO TABS: 50.0000 mg | ORAL_TABLET | Freq: Two times a day (BID) | ORAL | 1 refills | Status: DC

## 2018-06-20 ENCOUNTER — Other Ambulatory Visit: Payer: Self-pay

## 2018-06-20 MED ORDER — FLECAINIDE ACETATE 50 MG PO TABS: 50.0000 mg | ORAL_TABLET | Freq: Two times a day (BID) | ORAL | 0 refills | Status: DC

## 2018-07-07 ENCOUNTER — Ambulatory Visit: Payer: BLUE CROSS/BLUE SHIELD | Admitting: Cardiology

## 2018-08-08 ENCOUNTER — Telehealth: Payer: Self-pay | Admitting: Cardiology

## 2018-08-08 ENCOUNTER — Other Ambulatory Visit: Payer: Self-pay | Admitting: Emergency Medicine

## 2018-08-08 MED ORDER — FLECAINIDE ACETATE 50 MG PO TABS: 50.0000 mg | ORAL_TABLET | Freq: Two times a day (BID) | ORAL | 0 refills | Status: DC

## 2018-08-08 NOTE — Telephone Encounter (Signed)
°*  STAT* If patient is at the pharmacy, call can be transferred to refill team.   1. Which medications need to be refilled? (please list name of each medication and dose if known) Flecanide  2. Which pharmacy/location (including street and city if local pharmacy) is [facility]  3. Do they need a 30 day or 90 day supply? Can we call enough to last til 09/12/2018

## 2018-08-08 NOTE — Telephone Encounter (Signed)
Flecainide 50 mg twice daily refilled.  

## 2018-09-12 ENCOUNTER — Ambulatory Visit: Payer: BLUE CROSS/BLUE SHIELD | Admitting: Cardiology

## 2018-09-12 VITALS — BP 174/76 | HR 71 | Ht 61.0 in | Wt 192.6 lb

## 2018-09-12 DIAGNOSIS — I350 Nonrheumatic aortic (valve) stenosis: Secondary | ICD-10-CM | POA: Diagnosis not present

## 2018-09-12 DIAGNOSIS — I480 Paroxysmal atrial fibrillation: Secondary | ICD-10-CM

## 2018-09-12 DIAGNOSIS — I10 Essential (primary) hypertension: Secondary | ICD-10-CM

## 2018-09-12 DIAGNOSIS — I739 Peripheral vascular disease, unspecified: Secondary | ICD-10-CM

## 2018-09-12 DIAGNOSIS — F17200 Nicotine dependence, unspecified, uncomplicated: Secondary | ICD-10-CM | POA: Diagnosis not present

## 2018-09-12 LAB — HEPATIC FUNCTION PANEL
ALK PHOS: 88 IU/L (ref 39–117)
ALT: 23 IU/L (ref 0–32)
AST: 20 IU/L (ref 0–40)
Albumin: 4 g/dL (ref 3.6–4.8)
BILIRUBIN, DIRECT: 0.11 mg/dL (ref 0.00–0.40)
Bilirubin Total: 0.3 mg/dL (ref 0.0–1.2)
TOTAL PROTEIN: 6.5 g/dL (ref 6.0–8.5)

## 2018-09-12 LAB — LIPID PANEL
CHOLESTEROL TOTAL: 157 mg/dL (ref 100–199)
Chol/HDL Ratio: 4.5 ratio — ABNORMAL HIGH (ref 0.0–4.4)
HDL: 35 mg/dL — ABNORMAL LOW (ref >39)
LDL CALC: 105 mg/dL — AB (ref 0–99)
Triglycerides: 85 mg/dL (ref 0–149)
VLDL Cholesterol Cal: 17 mg/dL (ref 5–40)

## 2018-09-12 NOTE — Patient Instructions (Signed)
Medication Instructions:  Your physician recommends that you continue on your current medications as directed. Please refer to the Current Medication list given to you today.  If you need a refill on your cardiac medications before your next appointment, please call your pharmacy.   Lab work: Your physician recommends that you return for lab work today: Lft, Lipids  If you have labs (blood work) drawn today and your tests are completely normal, you will receive your results only by: Marland Kitchen MyChart Message (if you have MyChart) OR . A paper copy in the mail If you have any lab test that is abnormal or we need to change your treatment, we will call you to review the results.  Testing/Procedures: Your physician has requested that you have an echocardiogram. Echocardiography is a painless test that uses sound waves to create images of your heart. It provides your doctor with information about the size and shape of your heart and how well your heart's chambers and valves are working. This procedure takes approximately one hour. There are no restrictions for this procedure.   Your physician has requested that you have a carotid duplex. This test is an ultrasound of the carotid arteries in your neck. It looks at blood flow through these arteries that supply the brain with blood. Allow one hour for this exam. There are no restrictions or special instructions.   Your physician has requested that you have a lexiscan myoview. For further information please visit [email or web address] Please follow instruction sheet, as given.    Follow-Up: At [facility], you and your health needs are our priority.  As part of our continuing mission to provide you with exceptional heart care, we have created designated Provider Care Teams.  These Care Teams include your primary Cardiologist (physician) and Advanced Practice Providers (APPs -  Physician Assistants and Nurse Practitioners) who all work together to provide  you with the care you need, when you need it.  You will be scheduled to follow up with Dr. Bing Matter in 2 months.   Any Other Special Instructions Will Be Listed Below (If Applicable).    Echocardiogram An echocardiogram is a procedure that uses painless sound waves (ultrasound) to produce an image of the heart. Images from an echocardiogram can provide important information about:  Signs of coronary artery disease (CAD).  Aneurysm detection. An aneurysm is a weak or damaged part of an artery wall that bulges out from the normal force of blood pumping through the body.  Heart size and shape. Changes in the size or shape of the heart can be associated with certain conditions, including heart failure, aneurysm, and CAD.  Heart muscle function.  Heart valve function.  Signs of a past heart attack.  Fluid buildup around the heart.  Thickening of the heart muscle.  A tumor or infectious growth around the heart valves. Tell a health care provider about:  Any allergies you have.  All medicines you are taking, including vitamins, herbs, eye drops, creams, and over-the-counter medicines.  Any blood disorders you have.  Any surgeries you have had.  Any medical conditions you have.  Whether you are pregnant or may be pregnant. What are the risks? Generally, this is a safe procedure. However, problems may occur, including:  Allergic reaction to dye (contrast) that may be used during the procedure. What happens before the procedure? No specific preparation is needed. You may eat and drink normally. What happens during the procedure?   An IV tube may be inserted into one  of your veins.  You may receive contrast through this tube. A contrast is an injection that improves the quality of the pictures from your heart.  A gel will be applied to your chest.  A wand-like tool (transducer) will be moved over your chest. The gel will help to transmit the sound waves from the  transducer.  The sound waves will harmlessly bounce off of your heart to allow the heart images to be captured in real-time motion. The images will be recorded on a computer. The procedure may vary among health care providers and hospitals. What happens after the procedure?  You may return to your normal, everyday life, including diet, activities, and medicines, unless your health care provider tells you not to do that. Summary  An echocardiogram is a procedure that uses painless sound waves (ultrasound) to produce an image of the heart.  Images from an echocardiogram can provide important information about the size and shape of your heart, heart muscle function, heart valve function, and fluid buildup around your heart.  You do not need to do anything to prepare before this procedure. You may eat and drink normally.  After the echocardiogram is completed, you may return to your normal, everyday life, unless your health care provider tells you not to do that. This information is not intended to replace advice given to you by your health care provider. Make sure you discuss any questions you have with your health care provider. Document Released: 09/04/2000 Document Revised: 10/10/2016 Document Reviewed: 10/10/2016 Elsevier Interactive Patient Education  2019 Elsevier Inc.   Cardiac Nuclear Scan A cardiac nuclear scan is a test that measures blood flow to the heart when a person is resting and when he or she is exercising. The test looks for problems such as:  Not enough blood reaching a portion of the heart.  The heart muscle not working normally. You may need this test if:  You have heart disease.  You have had abnormal lab results.  You have had heart surgery or a balloon procedure to open up blocked arteries (angioplasty).  You have chest pain.  You have shortness of breath. In this test, a radioactive dye (tracer) is injected into your bloodstream. After the tracer has  traveled to your heart, an imaging device is used to measure how much of the tracer is absorbed by or distributed to various areas of your heart. This procedure is usually done at a hospital and takes 2-4 hours. Tell a health care provider about:  Any allergies you have.  All medicines you are taking, including vitamins, herbs, eye drops, creams, and over-the-counter medicines.  Any problems you or family members have had with anesthetic medicines.  Any blood disorders you have.  Any surgeries you have had.  Any medical conditions you have.  Whether you are pregnant or may be pregnant. What are the risks? Generally, this is a safe procedure. However, problems may occur, including:  Serious chest pain and heart attack. This is only a risk if the stress portion of the test is done.  Rapid heartbeat.  Sensation of warmth in your chest. This usually passes quickly.  Allergic reaction to the tracer. What happens before the procedure?  Ask your health care provider about changing or stopping your regular medicines. This is especially important if you are taking diabetes medicines or blood thinners.  Follow instructions from your health care provider about eating or drinking restrictions.  Remove your jewelry on the day of the procedure. What  happens during the procedure?  An IV will be inserted into one of your veins.  Your health care provider will inject a small amount of radioactive tracer through the IV.  You will wait for 20-40 minutes while the tracer travels through your bloodstream.  Your heart activity will be monitored with an electrocardiogram (ECG).  You will lie down on an exam table.  Images of your heart will be taken for about 15-20 minutes.  You may also have a stress test. For this test, one of the following may be done: ? You will exercise on a treadmill or stationary bike. While you exercise, your heart's activity will be monitored with an ECG, and your  blood pressure will be checked. ? You will be given medicines that will increase blood flow to parts of your heart. This is done if you are unable to exercise.  When blood flow to your heart has peaked, a tracer will again be injected through the IV.  After 20-40 minutes, you will get back on the exam table and have more images taken of your heart.  Depending on the type of tracer used, scans may need to be repeated 3-4 hours later.  Your IV line will be removed when the procedure is over. The procedure may vary among health care providers and hospitals. What happens after the procedure?  Unless your health care provider tells you otherwise, you may return to your normal schedule, including diet, activities, and medicines.  Unless your health care provider tells you otherwise, you may increase your fluid intake. This will help to flush the contrast dye from your body. Drink enough fluid to keep your urine pale yellow.  Ask your health care provider, or the department that is doing the test: ? When will my results be ready? ? How will I get my results? Summary  A cardiac nuclear scan measures the blood flow to the heart when a person is resting and when he or she is exercising.  Tell your health care provider if you are pregnant.  Before the procedure, ask your health care provider about changing or stopping your regular medicines. This is especially important if you are taking diabetes medicines or blood thinners.  After the procedure, unless your health care provider tells you otherwise, increase your fluid intake. This will help flush the contrast dye from your body.  After the procedure, unless your health care provider tells you otherwise, you may return to your normal schedule, including diet, activities, and medicines. This information is not intended to replace advice given to you by your health care provider. Make sure you discuss any questions you have with your health care  provider. Document Released: 10/02/2004 Document Revised: 02/21/2018 Document Reviewed: 02/21/2018 Elsevier Interactive Patient Education  2019 Elsevier Inc.   Carotid Artery Disease The carotid arteries are the two main arteries on either side of the neck. They supply blood to the brain, face, and neck. Carotid artery disease, also called carotid artery stenosis, is the narrowing or blockage of one or both carotid arteries. Carotid artery disease increases your risk for a stroke or a transient ischemic attack (TIA). A TIA is an episode in which blood flow to the brain is temporarily blocked. A TIA is considered a "warning stroke." What are the causes? This condition is primarily caused by buildup of plaque inside the carotid arteries (atherosclerosis). What increases the risk? The following factors may make you more likely to develop this condition:  High cholesterol (dyslipidemia).  High blood pressure (hypertension).  Smoking.  Obesity.  Diabetes.  Family history of cardiovascular disease.  Inactivity or lack of regular exercise.  Being female. Men have an increased risk of developing atherosclerosis earlier in life than women.  Old age. What are the signs or symptoms? This condition may not have any signs or symptoms until a stroke or TIA occurs. In some cases, your doctor may be able to hear a whooshing sound (bruit) which can indicate a change in blood flow due to plaque buildup. An eye exam can also help identify signs of the condition. How is this diagnosed? This condition may be diagnosed by:  A physical exam.  Your family and medical history.  Specific tests that look at the blood flow in the carotid arteries. These tests include: ? Carotid artery ultrasound. This test uses sound waves to create pictures of your arteries and show whether they are narrow or blocked. ? Carotid or cerebral angiography. During this test, a special dye will be injected into a vein. The dye  will show up on an X-ray to help highlight your arteries. ? Computerized tomographic angiography (CTA). During this test, a special dye will be injected into a vein. The dye will show up on the CT scan to help highlight your arteries. ? Magnetic resonance angiography (MRA). During this test, a special dye will be injected into a vein. The dye will show up on the MRI to help highlight your arteries. How is this treated? Treatment of this condition may include a combination of treatments. Treatment options include:  Lifestyle changes such as: ? Quitting smoking. ? Exercising regularly or as directed by your health care provider. ? Eating a heart-healthy diet. ? Managing stress. ? Maintaining a healthy weight.  Medicines to control: ? Blood pressure. ? Cholesterol. ? Blood clotting.  Surgery. You may have: ? A carotid endarterectomy. This is a surgery to remove the blockages in the carotid arteries. ? A carotid angioplasty with stenting. This is a procedure in which a wire mesh (stent) is used to widen the blocked carotid arteries. Follow these instructions at home: Lifestyle  Follow your health care provider's instructions about your diet. It is important to eat a healthy diet that is low in saturated fats and includes plenty of fresh fruits, vegetables, and lean meats. You should avoid high-fat, high-sodium foods as well as foods that are fried, overly processed, or have poor nutritional value.  Maintain a healthy weight.  Stay physically active. It is recommended that you get at least 150 minutes of moderate exercise or 75 minutes of strenuous exercise each week.  Do not use any products that contain nicotine or tobacco, such as cigarettes and e-cigarettes. If you need help quitting, ask your health care provider.  Limit alcohol intake to no more than 1 drink a day for non-pregnant women and 2 drinks a day for men. One drink equals 12 oz. of beer, 5 oz. of wine, or 1 oz. of hard  liquor.  Do not use illegal drugs.  Manage your stress. Ask your health care provider for stress management tips. General instructions  Take over-the-counter and prescription medicines only as told by your health care provider.  Keep all follow-up visits as told by your health care provider. This is important. Get help right away if:  You have any symptoms of stroke or TIA. The acronym BEFAST is an easy way to remember the main warning signs of stroke. ? B = Balance problems. Signs include dizziness,  sudden trouble walking, or loss of balance ? E = Eye problems. This includes trouble seeing or a sudden change in vision. ? F = Face changes. This includes sudden weakness or numbness of the face, or the face or eyelid drooping to one side. ? A = Arm weakness or numbness. This happens suddenly and usually on one side of the body. ? S = Speech problems. This includes trouble speaking or trouble understanding speech. ? T = Time. Time to call 911 or seek emergency care. Do not wait to see if symptoms go away. Make note of the time your symptoms started.  Other signs of stroke may include: ? A sudden, severe headache with no known cause. ? Nausea or vomiting. ? Seizure. These symptoms may represent a serious problem that is an emergency. Do not wait to see if the symptoms will go away. Get medical help right away. Call your local emergency services (911 in the [place] Do not drive yourself to the hospital. Summary  Carotid artery disease, also called carotid artery stenosis, is the narrowing or blockage of one or both carotid arteries.  Carotid artery disease increases your risk for a stroke or a transient ischemic attack (TIA).  This condition can be treated with lifestyle changes, medicines, surgery, or a combination of these treatments.  Do not use any products that contain nicotine or tobacco, such as cigarettes and e-cigarettes. If you need help quitting, ask your health care  provider. This information is not intended to replace advice given to you by your health care provider. Make sure you discuss any questions you have with your health care provider. Document Released: 11/30/2011 Document Revised: 10/12/2016 Document Reviewed: 10/12/2016 Elsevier Interactive Patient Education  [employer].   .

## 2018-09-12 NOTE — Progress Notes (Signed)
Cardiology Office Note:    Date:  09/12/2018   ID:  Linford ArnoldLinda F Weekly, DOB 1951/03/03, MRN [id number]  PCP:  Lise AuerKhan, Jaber A, MD  Cardiologist:  Gypsy Balsamobert Krasowski, MD    Referring MD: Lise AuerKhan, Jaber A, MD   Chief Complaint  Patient presents with  . Follow-up  Doing well  History of Present Illness:    Makayla Leach is a 67 y.o. female with multiple issues that include paroxysmal atrial fibrillation and flutter successfully suppressed with flecainide, refused anticoagulation.  History of peripheral vascular disease with a significant lesion of both carotid arteries that required to be rechecked.  Sadly she is continue to smoke comes today to office for follow-up overall doing well and denies have any chest pain tightness squeezing pressure burning chest.  Past Medical History:  Diagnosis Date  . Aortic stenosis   . CHF (congestive heart failure) (HCC)   . COPD (chronic obstructive pulmonary disease) (HCC)   . Diabetes mellitus without complication (HCC)   . Hypertension   . LAE (left atrial enlargement)   . Paroxysmal atrial fibrillation (HCC)   . Peripheral vascular disease [facility](HCC)     Past Surgical History:  Procedure Laterality Date  . TONSILLECTOMY    . TUBAL LIGATION      Current Medications: Current Meds  Medication Sig  . acetaminophen (TYLENOL) 500 MG tablet Take 500 mg by mouth every 8 (eight) hours as needed for mild pain.   Marland Kitchen. albuterol (ACCUNEB) 0.63 MG/3ML nebulizer solution Take 1 ampule by nebulization every 6 (six) hours as needed for wheezing.  Marland Kitchen. albuterol (PROVENTIL HFA;VENTOLIN HFA) 108 (90 Base) MCG/ACT inhaler Inhale 2 puffs into the lungs every 6 (six) hours as needed for wheezing or shortness of breath.  Marland Kitchen. amlodipine-benazepril (LOTREL) 2.5-10 MG capsule Take 1 capsule by mouth daily.  Marland Kitchen. aspirin EC 81 MG tablet Take 162 mg by mouth daily.  . empagliflozin (JARDIANCE) 25 MG TABS tablet Take 12.5 mg by mouth daily.  . flecainide (TAMBOCOR) 50 MG tablet Take 1  tablet (50 mg total) by mouth 2 (two) times daily.  . furosemide (LASIX) 20 MG tablet Take 20 mg by mouth daily as needed.  . metoprolol succinate (TOPROL-XL) 25 MG 24 hr tablet Take 1 tablet (25 mg total) by mouth 2 (two) times daily.  . ondansetron (ZOFRAN-ODT) 8 MG disintegrating tablet Take 8 mg by mouth as needed.     Allergies:   Patient has no known allergies.   Social History   Socioeconomic History  . Marital status: Married    Spouse name: Not on file  . Number of children: Not on file  . Years of education: Not on file  . Highest education level: Not on file  Occupational History  . Not on file  Social Needs  . Financial resource strain: Not on file  . Food insecurity:    Worry: Not on file    Inability: Not on file  . Transportation needs:    Medical: Not on file    Non-medical: Not on file  Tobacco Use  . Smoking status: Current Every Day Smoker  . Smokeless tobacco: Never Used  Substance and Sexual Activity  . Alcohol use: No  . Drug use: No  . Sexual activity: Not on file  Lifestyle  . Physical activity:    Days per week: Not on file    Minutes per session: Not on file  . Stress: Not on file  Relationships  . Social connections:  Talks on phone: Not on file    Gets together: Not on file    Attends religious service: Not on file    Active member of club or organization: Not on file    Attends meetings of clubs or organizations: Not on file    Relationship status: Not on file  Other Topics Concern  . Not on file  Social History Narrative  . Not on file     Family History: The patient's family history includes Diabetes in her brother; Heart disease in her father and mother; Hypertension in her brother, father, and mother. ROS:   Please see the history of present illness.    All 14 point review of systems negative except as described per history of present illness  EKGs/Labs/Other Studies Reviewed:    EKG shows normal sinus rhythm right bundle  branch block Recent Labs: 01/13/2018: ALT 18 01/15/2018: BUN 20; Creatinine, Ser 0.59; Hemoglobin 14.1; Magnesium 1.8; Platelets 162; Potassium 3.9; Sodium 141  Recent Lipid Panel    Component Value Date/Time   CHOL 168 11/09/2017 0827   TRIG 91 11/09/2017 0827   HDL 30 (L) 11/09/2017 0827   CHOLHDL 5.6 (H) 11/09/2017 0827   LDLCALC 120 (H) 11/09/2017 0827    Physical Exam:    VS:  BP (!) 174/76   Pulse 71   Ht 5\' 1"  (1.549 m)   Wt 192 lb 9.6 oz (87.4 kg)   SpO2 97%   BMI 36.39 kg/m     Wt Readings from Last 3 Encounters:  09/12/18 192 lb 9.6 oz (87.4 kg)  01/14/18 193 lb (87.5 kg)  11/01/17 197 lb (89.4 kg)     GEN:  Well nourished, well developed in no acute distress HEENT: Normal NECK: No JVD; No carotid bruits LYMPHATICS: No lymphadenopathy CARDIAC: RRR, no murmurs, no rubs, no gallops RESPIRATORY:  Clear to auscultation without rales, wheezing or rhonchi  ABDOMEN: Soft, non-tender, non-distended MUSCULOSKELETAL:  No edema; No deformity  SKIN: Warm and dry LOWER EXTREMITIES: no swelling NEUROLOGIC:  Alert and oriented x 3 PSYCHIATRIC:  Normal affect   ASSESSMENT:    1. Aortic stenosis, mild   2. Benign essential hypertension   3. Paroxysmal atrial fibrillation (HCC)   4. Smoking   5. Peripheral vascular disease (HCC)    PLAN:    In order of problems listed above:  1. Aortic stenosis mild on last echocardiogram a test need to be repeated which I will do.  Echocardiogram will be repeated 2. Benign essential hypertension she said she did not take her medications today.  Asking to take medications on the regular basis check her blood pressure on regular basis and bring results to us. 3. Paroxysmal atrial fibrillation denies having any palpitations.  She appears to be successfully suppressed with anti-arrhythmic therapy in form of flecainide however with her multiple comorbidities I need to check her heart make sure it safe for her to continue with this  management.  I will ask you to have echocardiogram as well as stress test to rule out ischemia. 4. Peripheral vascular disease time to recheck her cardiac ultrasounds which we will do. 5. Dyslipidemia she is reluctant to take cholesterol medication.  To have discussion with her about this she agreed to have it rechecked and then decide about cholesterol-lowering therapy. 6. Smoking obviously a problem we spent at least 5 minutes talking about techniques to quit she will try to do that   Medication Adjustments/Labs and Tests Ordered: Current medicines are reviewed at  length with the patient today.  Concerns regarding medicines are outlined above.  No orders of the defined types were placed in this encounter.  Medication changes: No orders of the defined types were placed in this encounter.   Signed, Georgeanna Lea, MD, [facility] 09/12/2018 10:41 AM    [facility]

## 2018-09-16 ENCOUNTER — Telehealth: Payer: Self-pay | Admitting: Emergency Medicine

## 2018-09-16 MED ORDER — EZETIMIBE 10 MG PO TABS: 10.0000 mg | ORAL_TABLET | Freq: Every day | ORAL | 1 refills | Status: DC

## 2018-09-16 NOTE — Telephone Encounter (Signed)
Patient informed of lab results and advised to start zetia 10 mg daily.

## 2018-10-03 ENCOUNTER — Other Ambulatory Visit: Payer: Self-pay

## 2018-10-03 MED ORDER — FLECAINIDE ACETATE 50 MG PO TABS: 50.0000 mg | ORAL_TABLET | Freq: Two times a day (BID) | ORAL | 1 refills | Status: DC

## 2018-10-14 ENCOUNTER — Other Ambulatory Visit: Payer: Self-pay | Admitting: Emergency Medicine

## 2018-10-14 MED ORDER — FLECAINIDE ACETATE 50 MG PO TABS: 50.0000 mg | ORAL_TABLET | Freq: Two times a day (BID) | ORAL | 0 refills | Status: DC

## 2018-10-25 ENCOUNTER — Other Ambulatory Visit: Payer: BLUE CROSS/BLUE SHIELD

## 2018-11-15 ENCOUNTER — Ambulatory Visit: Payer: BLUE CROSS/BLUE SHIELD | Admitting: Cardiology

## 2019-03-06 ENCOUNTER — Other Ambulatory Visit: Payer: Self-pay | Admitting: Cardiology

## 2019-05-30 ENCOUNTER — Telehealth: Payer: Self-pay | Admitting: Cardiology

## 2019-05-30 MED ORDER — FLECAINIDE ACETATE 50 MG PO TABS: 50.0000 mg | ORAL_TABLET | Freq: Two times a day (BID) | ORAL | 1 refills | Status: DC

## 2019-05-30 NOTE — Telephone Encounter (Signed)
Flecainide 50 mg twice daily refilled.

## 2019-05-30 NOTE — Addendum Note (Signed)
Addended by: Ashok Norris on: 05/30/2019 11:48 AM   Modules accepted: Orders

## 2019-05-30 NOTE — Telephone Encounter (Signed)
Call flecinide to [facility]

## 2019-10-24 ENCOUNTER — Telehealth: Payer: Self-pay | Admitting: Cardiology

## 2019-10-24 ENCOUNTER — Other Ambulatory Visit: Payer: Self-pay | Admitting: *Deleted

## 2019-10-24 ENCOUNTER — Other Ambulatory Visit: Payer: Self-pay | Admitting: Cardiology

## 2019-10-24 MED ORDER — FLECAINIDE ACETATE 50 MG PO TABS: 50.0000 mg | ORAL_TABLET | Freq: Two times a day (BID) | ORAL | 1 refills | Status: DC

## 2019-10-24 NOTE — Telephone Encounter (Signed)
Refill sent.

## 2019-10-24 NOTE — Telephone Encounter (Signed)
*  STAT* If patient is at the pharmacy, call can be transferred to refill team.   1. Which medications need to be refilled? (please list name of each medication and dose if known) flecainide (TAMBOCOR) 50 MG tablet  2. Which pharmacy/location (including street and city if local pharmacy) is medication to be sent to?  [facility] - [place] - [address]  3. Do they need a 30 day or 90 day supply? 90 day supply   Patient only has enough medication to last til 10/27/19.

## 2019-11-06 ENCOUNTER — Encounter: Payer: Self-pay | Admitting: Cardiology

## 2019-11-06 ENCOUNTER — Other Ambulatory Visit: Payer: Self-pay

## 2019-11-06 ENCOUNTER — Ambulatory Visit (INDEPENDENT_AMBULATORY_CARE_PROVIDER_SITE_OTHER): Payer: Medicare Other | Admitting: Cardiology

## 2019-11-06 VITALS — HR 78 | Ht 61.0 in | Wt 202.0 lb

## 2019-11-06 DIAGNOSIS — I10 Essential (primary) hypertension: Secondary | ICD-10-CM

## 2019-11-06 DIAGNOSIS — R0609 Other forms of dyspnea: Secondary | ICD-10-CM

## 2019-11-06 DIAGNOSIS — I480 Paroxysmal atrial fibrillation: Secondary | ICD-10-CM | POA: Diagnosis not present

## 2019-11-06 DIAGNOSIS — F17200 Nicotine dependence, unspecified, uncomplicated: Secondary | ICD-10-CM

## 2019-11-06 DIAGNOSIS — I350 Nonrheumatic aortic (valve) stenosis: Secondary | ICD-10-CM

## 2019-11-06 DIAGNOSIS — E119 Type 2 diabetes mellitus without complications: Secondary | ICD-10-CM

## 2019-11-06 DIAGNOSIS — J410 Simple chronic bronchitis: Secondary | ICD-10-CM

## 2019-11-06 DIAGNOSIS — R0600 Dyspnea, unspecified: Secondary | ICD-10-CM

## 2019-11-06 MED ORDER — FLECAINIDE ACETATE 50 MG PO TABS: 50.0000 mg | ORAL_TABLET | Freq: Two times a day (BID) | ORAL | 6 refills | Status: DC

## 2019-11-06 NOTE — Progress Notes (Signed)
Cardiology Office Note:    Date:  11/06/2019   ID:  Brittannie, Tawney 09/25/1950, MRN [id number]  PCP:  Mateo Flow, MD  Cardiologist:  Jenne Campus, MD    Referring MD: Mateo Flow, MD   No chief complaint on file. Short of breath  History of Present Illness:    Makayla Leach is a 69 y.o. female with past medical history significant for aortic stenosis, last assessment 2018, that was mild, COPD which is related to smoking which is ongoing, essential hypertension, paroxysmal atrial fibrillation, refusing anticoagulation.  She comes today to my office after not seeing me for more than a year.  The problem is she is having shortness of breath.  Shortness of breath is worse with exertion.  Also worse when she is laying flat, denies have any chest pain, tightness, pressure, burning in the chest.  She did notice also some swelling of lower extremities especially in the evening time.  Past Medical History:  Diagnosis Date  . Aortic stenosis   . CHF (congestive heart failure) ([place])   . COPD (chronic obstructive pulmonary disease) ([place])   . Diabetes mellitus without complication ([place])   . Hypertension   . LAE (left atrial enlargement)   . Paroxysmal atrial fibrillation (HCC)   . Peripheral vascular disease [facility])     Past Surgical History:  Procedure Laterality Date  . TONSILLECTOMY    . TUBAL LIGATION      Current Medications: Current Meds  Medication Sig  . acetaminophen (TYLENOL) 500 MG tablet Take 500 mg by mouth every 8 (eight) hours as needed for mild pain.   Marland Kitchen albuterol (ACCUNEB) 0.63 MG/3ML nebulizer solution Take 1 ampule by nebulization every 6 (six) hours as needed for wheezing.  Marland Kitchen albuterol (PROVENTIL HFA;VENTOLIN HFA) 108 (90 Base) MCG/ACT inhaler Inhale 2 puffs into the lungs every 6 (six) hours as needed for wheezing or shortness of breath.  Marland Kitchen amlodipine-benazepril (LOTREL) 2.5-10 MG capsule Take 1 capsule by mouth daily.  Marland Kitchen aspirin EC 81 MG tablet Take  162 mg by mouth daily.  . flecainide (TAMBOCOR) 50 MG tablet Take 1 tablet (50 mg total) by mouth 2 (two) times daily.  . furosemide (LASIX) 20 MG tablet Take 20 mg by mouth daily as needed.  Marland Kitchen HYDROcodone-acetaminophen (NORCO/VICODIN) 5-325 MG tablet Take 1 tablet by mouth every 6 (six) hours as needed for moderate pain.  . sodium chloride (OCEAN) 0.65 % SOLN nasal spray Place 1 spray into both nostrils as needed for congestion.  . [DISCONTINUED] clindamycin (CLEOCIN) 300 MG capsule Take 1 capsule (300 mg total) by mouth every 8 (eight) hours.  . [DISCONTINUED] empagliflozin (JARDIANCE) 25 MG TABS tablet Take 12.5 mg by mouth daily.  . [DISCONTINUED] metoprolol succinate (TOPROL-XL) 25 MG 24 hr tablet Take 1 tablet (25 mg total) by mouth 2 (two) times daily.  . [DISCONTINUED] ondansetron (ZOFRAN-ODT) 8 MG disintegrating tablet Take 8 mg by mouth as needed.     Allergies:   Lipitor [atorvastatin calcium]   Social History   Socioeconomic History  . Marital status: Married    Spouse name: Not on file  . Number of children: Not on file  . Years of education: Not on file  . Highest education level: Not on file  Occupational History  . Not on file  Tobacco Use  . Smoking status: Current Every Day Smoker  . Smokeless tobacco: Never Used  Substance and Sexual Activity  . Alcohol use: No  . Drug  use: No  . Sexual activity: Not on file  Other Topics Concern  . Not on file  Social History Narrative  . Not on file   Social Determinants of Health   Financial Resource Strain:   . Difficulty of Paying Living Expenses: Not on file  Food Insecurity:   . Worried About Programme researcher, broadcasting/film/video in the Last Year: Not on file  . Ran Out of Food in the Last Year: Not on file  Transportation Needs:   . Lack of Transportation (Medical): Not on file  . Lack of Transportation (Non-Medical): Not on file  Physical Activity:   . Days of Exercise per Week: Not on file  . Minutes of Exercise per Session:  Not on file  Stress:   . Feeling of Stress : Not on file  Social Connections:   . Frequency of Communication with Friends and Family: Not on file  . Frequency of Social Gatherings with Friends and Family: Not on file  . Attends Religious Services: Not on file  . Active Member of Clubs or Organizations: Not on file  . Attends Banker Meetings: Not on file  . Marital Status: Not on file     Family History: The patient's family history includes Diabetes in her brother; Heart disease in her father and mother; Hypertension in her brother, father, and mother. ROS:   Please see the history of present illness.    All 14 point review of systems negative except as described per history of present illness  EKGs/Labs/Other Studies Reviewed:      Recent Labs: No results found for requested labs within last 8760 hours.  Recent Lipid Panel    Component Value Date/Time   CHOL 157 09/12/2018 1103   TRIG 85 09/12/2018 1103   HDL 35 (L) 09/12/2018 1103   CHOLHDL 4.5 (H) 09/12/2018 1103   LDLCALC 105 (H) 09/12/2018 1103    Physical Exam:    VS:  Pulse 78   Ht 5\' 1"  (1.549 m)   Wt 202 lb (91.6 kg)   SpO2 90%   BMI 38.17 kg/m     Wt Readings from Last 3 Encounters:  11/06/19 202 lb (91.6 kg)  09/12/18 192 lb 9.6 oz (87.4 kg)  01/14/18 193 lb (87.5 kg)     GEN:  Well nourished, well developed in no acute distress HEENT: Normal NECK: No JVD; No carotid bruits LYMPHATICS: No lymphadenopathy CARDIAC: RRR, systolic ejection murmur grade 1/6 to 2/6 best heard right upper portion of the sternum, no rubs, no gallops RESPIRATORY:  Clear to auscultation without rales, wheezing or rhonchi  ABDOMEN: Soft, non-tender, non-distended MUSCULOSKELETAL:  No edema; No deformity  SKIN: Warm and dry LOWER EXTREMITIES: no swelling NEUROLOGIC:  Alert and oriented x 3 PSYCHIATRIC:  Normal affect   ASSESSMENT:    1. Paroxysmal atrial fibrillation (HCC)   2. Benign essential  hypertension   3. Aortic stenosis, mild   4. Simple chronic bronchitis (HCC)   5. Smoking   6. Type 2 diabetes mellitus without complication, without long-term current use of insulin (HCC)    PLAN:    In order of problems listed above:  1. Paroxysmal atrial fibrillation, EKG will be done today to check the rhythm.  Again asked about anticoagulation she refused.  I tried to explain to her what the rationale for taking this medication is. 2. Benign essential hypertension her blood pressure is elevated today, she did not take her medications yet.  She ran out of  some of the medication.  We will refill all his medications. 3. Aortic stenosis, mild based on last assessment 2018.  Echocardiogram will be repeated. 4. Bronchitis with chronic smoking still ongoing issue spent a great deal of time talking to her about need to quit smoking she understands she will try to do that. 5. Type 2 diabetes apparently stable. 6. Dyspnea on exertion with some component of proximal nocturnal dyspnea.  I will get echocardiogram quickly.  Today we will do complete metabolic panel, proBNP outstsh.  I am worried about her and I would like to see her quickly I will see her back in my office next week.  She told me that she has no way to get to the office next week.  She does not have a car.  Therefore at least we will do a televisit with her.  Also some disposition in terms of medications and diuretic will be made based on results of her blood test to be done today.  She will also have echocardiogram which helps to determine what the problem is.  She is taking flecainide, obviously, if she got diminished left ventricular ejection fraction flecainide need to be discontinued.   Medication Adjustments/Labs and Tests Ordered: Current medicines are reviewed at length with the patient today.  Concerns regarding medicines are outlined above.  No orders of the defined types were placed in this encounter.  Medication changes:  No orders of the defined types were placed in this encounter.   Signed, Georgeanna Lea, MD, [facility] 11/06/2019 11:08 AM    [facility]

## 2019-11-06 NOTE — Patient Instructions (Signed)
Medication Instructions:  Your physician recommends that you continue on your current medications as directed. Please refer to the Current Medication list given to you today.  *If you need a refill on your cardiac medications before your next appointment, please call your pharmacy*  Lab Work: Your physician recommends that you return for lab work today: cmp, pro bnp, cbc, tsh  If you have labs (blood work) drawn today and your tests are completely normal, you will receive your results only by: Marland Kitchen MyChart Message (if you have MyChart) OR . A paper copy in the mail If you have any lab test that is abnormal or we need to change your treatment, we will call you to review the results.  Testing/Procedures: Your physician has requested that you have an echocardiogram. Echocardiography is a painless test that uses sound waves to create images of your heart. It provides your doctor with information about the size and shape of your heart and how well your heart's chambers and valves are working. This procedure takes approximately one hour. There are no restrictions for this procedure.    Follow-Up: At [facility], you and your health needs are our priority.  As part of our continuing mission to provide you with exceptional heart care, we have created designated Provider Care Teams.  These Care Teams include your primary Cardiologist (physician) and Advanced Practice Providers (APPs -  Physician Assistants and Nurse Practitioners) who all work together to provide you with the care you need, when you need it.  Your next appointment:   1 week(s)  The format for your next appointment:   Virtual Visit   Provider:   Gypsy Balsam, MD  Other Instructions   Echocardiogram An echocardiogram is a procedure that uses painless sound waves (ultrasound) to produce an image of the heart. Images from an echocardiogram can provide important information about:  Signs of coronary artery disease  (CAD).  Aneurysm detection. An aneurysm is a weak or damaged part of an artery wall that bulges out from the normal force of blood pumping through the body.  Heart size and shape. Changes in the size or shape of the heart can be associated with certain conditions, including heart failure, aneurysm, and CAD.  Heart muscle function.  Heart valve function.  Signs of a past heart attack.  Fluid buildup around the heart.  Thickening of the heart muscle.  A tumor or infectious growth around the heart valves. Tell a health care provider about:  Any allergies you have.  All medicines you are taking, including vitamins, herbs, eye drops, creams, and over-the-counter medicines.  Any blood disorders you have.  Any surgeries you have had.  Any medical conditions you have.  Whether you are pregnant or may be pregnant. What are the risks? Generally, this is a safe procedure. However, problems may occur, including:  Allergic reaction to dye (contrast) that may be used during the procedure. What happens before the procedure? No specific preparation is needed. You may eat and drink normally. What happens during the procedure?   An IV tube may be inserted into one of your veins.  You may receive contrast through this tube. A contrast is an injection that improves the quality of the pictures from your heart.  A gel will be applied to your chest.  A wand-like tool (transducer) will be moved over your chest. The gel will help to transmit the sound waves from the transducer.  The sound waves will harmlessly bounce off of your heart to  allow the heart images to be captured in real-time motion. The images will be recorded on a computer. The procedure may vary among health care providers and hospitals. What happens after the procedure?  You may return to your normal, everyday life, including diet, activities, and medicines, unless your health care provider tells you not to do  that. Summary  An echocardiogram is a procedure that uses painless sound waves (ultrasound) to produce an image of the heart.  Images from an echocardiogram can provide important information about the size and shape of your heart, heart muscle function, heart valve function, and fluid buildup around your heart.  You do not need to do anything to prepare before this procedure. You may eat and drink normally.  After the echocardiogram is completed, you may return to your normal, everyday life, unless your health care provider tells you not to do that. This information is not intended to replace advice given to you by your health care provider. Make sure you discuss any questions you have with your health care provider. Document Revised: 12/29/2018 Document Reviewed: 10/10/2016 Elsevier Patient Education  [place].

## 2019-11-07 LAB — COMPREHENSIVE METABOLIC PANEL
ALT: 17 IU/L (ref 0–32)
AST: 19 IU/L (ref 0–40)
Albumin/Globulin Ratio: 1.4 (ref 1.2–2.2)
Albumin: 3.8 g/dL (ref 3.8–4.8)
Alkaline Phosphatase: 101 IU/L (ref 39–117)
BUN/Creatinine Ratio: 15 (ref 12–28)
BUN: 11 mg/dL (ref 8–27)
Bilirubin Total: 0.5 mg/dL (ref 0.0–1.2)
CO2: 25 mmol/L (ref 20–29)
Calcium: 9.5 mg/dL (ref 8.7–10.3)
Chloride: 102 mmol/L (ref 96–106)
Creatinine, Ser: 0.74 mg/dL (ref 0.57–1.00)
GFR calc Af Amer: 98 mL/min/{1.73_m2} (ref >59)
GFR calc non Af Amer: 85 mL/min/{1.73_m2} (ref >59)
Globulin, Total: 2.8 g/dL (ref 1.5–4.5)
Glucose: 117 mg/dL — ABNORMAL HIGH (ref 65–99)
Potassium: 3.8 mmol/L (ref 3.5–5.2)
Sodium: 144 mmol/L (ref 134–144)
Total Protein: 6.6 g/dL (ref 6.0–8.5)

## 2019-11-07 LAB — CBC
Hematocrit: 54 % — ABNORMAL HIGH (ref 34.0–46.6)
Hemoglobin: 18.1 g/dL — ABNORMAL HIGH (ref 11.1–15.9)
MCH: 30.4 pg (ref 26.6–33.0)
MCHC: 33.5 g/dL (ref 31.5–35.7)
MCV: 91 fL (ref 79–97)
Platelets: 169 10*3/uL (ref 150–450)
RBC: 5.96 x10E6/uL — ABNORMAL HIGH (ref 3.77–5.28)
RDW: 14 % (ref 11.7–15.4)
WBC: 11.8 10*3/uL — ABNORMAL HIGH (ref 3.4–10.8)

## 2019-11-07 LAB — PRO B NATRIURETIC PEPTIDE: NT-Pro BNP: 2116 pg/mL — ABNORMAL HIGH (ref 0–301)

## 2019-11-07 LAB — TSH: TSH: 2.35 u[IU]/mL (ref 0.450–4.500)

## 2019-11-08 ENCOUNTER — Telehealth: Payer: Self-pay | Admitting: Cardiology

## 2019-11-08 DIAGNOSIS — I340 Nonrheumatic mitral (valve) insufficiency: Secondary | ICD-10-CM

## 2019-11-08 DIAGNOSIS — I351 Nonrheumatic aortic (valve) insufficiency: Secondary | ICD-10-CM

## 2019-11-08 DIAGNOSIS — I361 Nonrheumatic tricuspid (valve) insufficiency: Secondary | ICD-10-CM

## 2019-11-08 NOTE — Telephone Encounter (Signed)
Pt c/o BP issue: STAT if pt c/o blurred vision, one-sided weakness or slurred speech  1. What are your last 5 BP readings?   Left arm: 229/94     Right arm: 235/91  2. Are you having any other symptoms (ex. Dizziness, headache, blurred vision, passed out)?   3. What is your BP issue? Jan with [facility] states the patient's BP is elevated and she is requesting to advise with Dr. Bing Matter. Call dropped prior to receiving more information or a confirmed call back number.

## 2019-11-08 NOTE — Telephone Encounter (Signed)
Dr. Bing Matter has already spoke with hospital about this patient.

## 2019-11-09 ENCOUNTER — Telehealth: Payer: Self-pay | Admitting: *Deleted

## 2019-11-09 DIAGNOSIS — I509 Heart failure, unspecified: Secondary | ICD-10-CM | POA: Insufficient documentation

## 2019-11-09 DIAGNOSIS — I10 Essential (primary) hypertension: Secondary | ICD-10-CM

## 2019-11-09 DIAGNOSIS — I480 Paroxysmal atrial fibrillation: Secondary | ICD-10-CM

## 2019-11-09 MED ORDER — POTASSIUM CHLORIDE ER 10 MEQ PO TBCR: 10.0000 meq | EXTENDED_RELEASE_TABLET | Freq: Every day | ORAL | 1 refills | Status: DC

## 2019-11-09 MED ORDER — FUROSEMIDE 40 MG PO TABS: 40.0000 mg | ORAL_TABLET | Freq: Every day | ORAL | 1 refills | Status: DC

## 2019-11-09 NOTE — Telephone Encounter (Signed)
Patient informed and verbalized understanding of plan. Copy sent to PCP 

## 2019-11-09 NOTE — Telephone Encounter (Signed)
-----   Message from Georgeanna Lea, MD sent at 11/08/2019 12:50 PM EST ----- Increase furosemide to 40 mg daily, add 10 mg of potassium, Chem-7 proBNP next week

## 2019-11-10 ENCOUNTER — Telehealth: Payer: Self-pay | Admitting: Cardiology

## 2019-11-10 NOTE — Telephone Encounter (Signed)
New message:     Patient calling and would like to know when and where should she go for labs. Please cal patient.

## 2019-11-10 NOTE — Telephone Encounter (Signed)
encounter not needed 

## 2019-11-10 NOTE — Telephone Encounter (Signed)
Spoke with patient. Patient educated that she may return to the [place] office for lab work to be completed next week.

## 2019-11-13 ENCOUNTER — Encounter: Payer: Self-pay | Admitting: Cardiology

## 2019-11-13 ENCOUNTER — Telehealth (INDEPENDENT_AMBULATORY_CARE_PROVIDER_SITE_OTHER): Payer: Medicare Other | Admitting: Cardiology

## 2019-11-13 ENCOUNTER — Other Ambulatory Visit: Payer: Self-pay

## 2019-11-13 VITALS — HR 68 | Ht 61.0 in | Wt 196.8 lb

## 2019-11-13 DIAGNOSIS — F17200 Nicotine dependence, unspecified, uncomplicated: Secondary | ICD-10-CM | POA: Diagnosis not present

## 2019-11-13 DIAGNOSIS — I480 Paroxysmal atrial fibrillation: Secondary | ICD-10-CM

## 2019-11-13 DIAGNOSIS — IMO0001 Reserved for inherently not codable concepts without codable children: Secondary | ICD-10-CM

## 2019-11-13 DIAGNOSIS — I350 Nonrheumatic aortic (valve) stenosis: Secondary | ICD-10-CM | POA: Diagnosis not present

## 2019-11-13 DIAGNOSIS — I10 Essential (primary) hypertension: Secondary | ICD-10-CM

## 2019-11-13 DIAGNOSIS — E119 Type 2 diabetes mellitus without complications: Secondary | ICD-10-CM

## 2019-11-13 DIAGNOSIS — I509 Heart failure, unspecified: Secondary | ICD-10-CM

## 2019-11-13 NOTE — Progress Notes (Signed)
Virtual Visit via Telephone Note   This visit type was conducted due to national recommendations for restrictions regarding the COVID-19 Pandemic (e.g. social distancing) in an effort to limit this patient's exposure and mitigate transmission in our community.  Due to her co-morbid illnesses, this patient is at least at moderate risk for complications without adequate follow up.  This format is felt to be most appropriate for this patient at this time.  The patient did not have access to video technology/had technical difficulties with video requiring transitioning to audio format only (telephone).  All issues noted in this document were discussed and addressed.  No physical exam could be performed with this format.  Please refer to the patient's chart for her  consent to telehealth for [facility].  Evaluation Performed:  Follow-up visit  This visit type was conducted due to national recommendations for restrictions regarding the COVID-19 Pandemic (e.g. social distancing).  This format is felt to be most appropriate for this patient at this time.  All issues noted in this document were discussed and addressed.  No physical exam was performed (except for noted visual exam findings with Video Visits).  Please refer to the patient's chart (MyChart message for video visits and phone note for telephone visits) for the patient's consent to telehealth for [facility].  Date:  11/13/2019  ID: Makayla, Leach 28-Mar-1951, MRN [id number]   Patient Location: [address] [place] [address]   Provider location:   [facility] [place] Office  PCP:  Lise Auer, MD  Cardiologist:  Gypsy Balsam, MD     Chief Complaint: Doing slightly better  History of Present Illness:    Makayla Leach is a 69 y.o. female  who presents via audio/video conferencing for a telehealth visit today.  Past medical history significant for paroxysmal atrial fibrillation, mild aortic stenosis, COPD related to  smoking, essential hypertension.  Came to my office last time just a week ago complaining of having some shortness of breath.  He was some concern about potentially having aortic stenosis.  Likely echocardiogram done in the hospital showed only mild aortic stenosis as well as mild mitral stenosis.  Her left ventricle ejection fraction was normal.  I also did proBNP which is significantly elevated.  I recommended to start Lasix, however she did not have a chance to do it.  Today she took first dose of furosemide with potassium.   The patient does not have symptoms concerning for COVID-19 infection (fever, chills, cough, or new SHORTNESS OF BREATH).    Prior CV studies:   The following studies were reviewed today:  Echocardiogram done in the hospital showed normal left ventricle ejection fraction, there was mild aortic stenosis, as well mild mitral stenosis.     Past Medical History:  Diagnosis Date  . Aortic stenosis   . CHF (congestive heart failure) (HCC)   . COPD (chronic obstructive pulmonary disease) (HCC)   . Diabetes mellitus without complication (HCC)   . Hypertension   . LAE (left atrial enlargement)   . Paroxysmal atrial fibrillation (HCC)   . Peripheral vascular disease [facility])     Past Surgical History:  Procedure Laterality Date  . TONSILLECTOMY    . TUBAL LIGATION       Current Meds  Medication Sig  . acetaminophen (TYLENOL) 500 MG tablet Take 500 mg by mouth every 8 (eight) hours as needed for mild pain.   Marland Kitchen albuterol (ACCUNEB) 0.63 MG/3ML nebulizer solution Take  1 ampule by nebulization every 6 (six) hours as needed for wheezing.  Marland Kitchen albuterol (PROVENTIL HFA;VENTOLIN HFA) 108 (90 Base) MCG/ACT inhaler Inhale 2 puffs into the lungs every 6 (six) hours as needed for wheezing or shortness of breath.  Marland Kitchen amlodipine-benazepril (LOTREL) 2.5-10 MG capsule Take 1 capsule by mouth daily.  Marland Kitchen aspirin EC 81 MG tablet Take 162 mg by mouth daily.  . flecainide (TAMBOCOR) 50 MG  tablet Take 1 tablet (50 mg total) by mouth 2 (two) times daily.  . furosemide (LASIX) 40 MG tablet Take 1 tablet (40 mg total) by mouth daily. 11/09/2019 dose increase  . HYDROcodone-acetaminophen (NORCO/VICODIN) 5-325 MG tablet Take 1 tablet by mouth every 6 (six) hours as needed for moderate pain.  . metoprolol succinate (TOPROL-XL) 50 MG 24 hr tablet Take 50 mg by mouth 2 (two) times daily.  . potassium chloride (KLOR-CON) 10 MEQ tablet Take 1 tablet (10 mEq total) by mouth daily.  . sodium chloride (OCEAN) 0.65 % SOLN nasal spray Place 1 spray into both nostrils as needed for congestion.      Family History: The patient's family history includes Diabetes in her brother; Heart disease in her father and mother; Hypertension in her brother, father, and mother.   ROS:   Please see the history of present illness.     All other systems reviewed and are negative.   Labs/Other Tests and Data Reviewed:     Recent Labs: 11/06/2019: ALT 17; BUN 11; Creatinine, Ser 0.74; Hemoglobin 18.1; NT-Pro BNP 2,116; Platelets 169; Potassium 3.8; Sodium 144; TSH 2.350  Recent Lipid Panel    Component Value Date/Time   CHOL 157 09/12/2018 1103   TRIG 85 09/12/2018 1103   HDL 35 (L) 09/12/2018 1103   CHOLHDL 4.5 (H) 09/12/2018 1103   LDLCALC 105 (H) 09/12/2018 1103      Exam:    Vital Signs:  Pulse 68   Ht 5\' 1"  (1.549 m)   Wt 196 lb 12.8 oz (89.3 kg)   BMI 37.19 kg/m     Wt Readings from Last 3 Encounters:  11/13/19 196 lb 12.8 oz (89.3 kg)  11/06/19 202 lb (91.6 kg)  09/12/18 192 lb 9.6 oz (87.4 kg)     Well nourished, well developed in no acute distress. Alert awake and x3 talking to me over the phone.  We are unable to establish video link.  Not in any distress.  Diagnosis for this visit:   1. Aortic stenosis, mild   2. Benign essential hypertension   3. Paroxysmal atrial fibrillation (HCC)   4. Smoking   5. Type 2 diabetes mellitus without complication, without long-term  current use of insulin ([place])      ASSESSMENT & PLAN:    1.  Multiple external continue present management. 2.  Dyspnea on exertion which is related to congestive heart failure which is probably diastolic.  I asked her to start taking 40 mg of furosemide with 10 mg of potassium.  She will come back to my office next Monday to have Chem-7 done as well as for quick visit. 3.  Benign essential hypertension blood pressure heart elevated but now looks much better.  We will continue present management. 4.  Smoking obviously a problem we spent time talking about this she understands need to quit. 5.  Type 2 diabetes stable.  COVID-19 Education: The signs and symptoms of COVID-19 were discussed with the patient and how to seek care for testing (follow up with PCP or arrange  E-visit).  The importance of social distancing was discussed today.  Patient Risk:   After full review of this patients clinical status, I feel that they are at least moderate risk at this time.  Time:   Today, I have spent 5 minutes with the patient with telehealth technology discussing pt health issues.  I spent 15 minutes reviewing her chart before the visit.  Visit was finished at 1:30 PM.    Medication Adjustments/Labs and Tests Ordered: Current medicines are reviewed at length with the patient today.  Concerns regarding medicines are outlined above.  No orders of the defined types were placed in this encounter.  Medication changes: No orders of the defined types were placed in this encounter.    Disposition: Follow-up next week on Monday  Signed, Georgeanna Lea, MD, [facility] 11/13/2019 1:28 PM     [facility]

## 2019-11-13 NOTE — Patient Instructions (Signed)
Medication Instructions:  Your physician recommends that you continue on your current medications as directed. Please refer to the Current Medication list given to you today.  *If you need a refill on your cardiac medications before your next appointment, please call your pharmacy*  Lab Work: Your physician recommends that you return for lab work : bmp, pro bnp  If you have labs (blood work) drawn today and your tests are completely normal, you will receive your results only by: Marland Kitchen MyChart Message (if you have MyChart) OR . A paper copy in the mail If you have any lab test that is abnormal or we need to change your treatment, we will call you to review the results.  Testing/Procedures: None.   Follow-Up: At [facility], you and your health needs are our priority.  As part of our continuing mission to provide you with exceptional heart care, we have created designated Provider Care Teams.  These Care Teams include your primary Cardiologist (physician) and Advanced Practice Providers (APPs -  Physician Assistants and Nurse Practitioners) who all work together to provide you with the care you need, when you need it.  Your next appointment:   1 week(s)  The format for your next appointment:   In Person  Provider:   You may see Gypsy Balsam, MD or the following Advanced Practice Provider on your designated Care Team:    Gillian Shields, FNP   Other Instructions

## 2019-11-20 ENCOUNTER — Other Ambulatory Visit: Payer: Self-pay

## 2019-11-20 ENCOUNTER — Encounter: Payer: Self-pay | Admitting: Cardiology

## 2019-11-20 ENCOUNTER — Ambulatory Visit (INDEPENDENT_AMBULATORY_CARE_PROVIDER_SITE_OTHER): Payer: Medicare Other | Admitting: Cardiology

## 2019-11-20 VITALS — BP 142/88 | HR 79 | Temp 98.0°F | Ht 61.0 in | Wt 201.0 lb

## 2019-11-20 DIAGNOSIS — I10 Essential (primary) hypertension: Secondary | ICD-10-CM

## 2019-11-20 DIAGNOSIS — I509 Heart failure, unspecified: Secondary | ICD-10-CM | POA: Diagnosis not present

## 2019-11-20 DIAGNOSIS — I350 Nonrheumatic aortic (valve) stenosis: Secondary | ICD-10-CM

## 2019-11-20 DIAGNOSIS — I480 Paroxysmal atrial fibrillation: Secondary | ICD-10-CM | POA: Diagnosis not present

## 2019-11-20 DIAGNOSIS — R0609 Other forms of dyspnea: Secondary | ICD-10-CM

## 2019-11-20 DIAGNOSIS — R0600 Dyspnea, unspecified: Secondary | ICD-10-CM

## 2019-11-20 NOTE — Progress Notes (Signed)
Cardiology Office Note:    Date:  11/20/2019   ID:  Makayla, Leach 04/29/1951, MRN [id number]  PCP:  Lise Auer, MD  Cardiologist:  Gypsy Balsam, MD    Referring MD: Lise Auer, MD   No chief complaint on file. I am better  History of Present Illness:    Makayla Leach is a 69 y.o. female with past medical history significant for mild aortic stenosis mitral stenosis, COPD, essential hypertension shortness of breath.  She was found to have proBNP which was significantly elevated, I think we dealing with diastolic dysfunction.  I initiated Lasix as well as potassium she does better swelling of lower extremities, shortness of breath improved.  However, she told me that she is disappointed that she is getting better slower pace.  I told her to take some time today we will check Chem-7 as well as proBNP and then will decide about continuation of the therapy.  Past Medical History:  Diagnosis Date  . Aortic stenosis   . CHF (congestive heart failure) (HCC)   . COPD (chronic obstructive pulmonary disease) (HCC)   . Diabetes mellitus without complication (HCC)   . Hypertension   . LAE (left atrial enlargement)   . Paroxysmal atrial fibrillation (HCC)   . Peripheral vascular disease [facility])     Past Surgical History:  Procedure Laterality Date  . TONSILLECTOMY    . TUBAL LIGATION      Current Medications: Current Meds  Medication Sig  . acetaminophen (TYLENOL) 500 MG tablet Take 500 mg by mouth every 8 (eight) hours as needed for mild pain.   Marland Kitchen albuterol (ACCUNEB) 0.63 MG/3ML nebulizer solution Take 1 ampule by nebulization every 6 (six) hours as needed for wheezing.  Marland Kitchen albuterol (PROVENTIL HFA;VENTOLIN HFA) 108 (90 Base) MCG/ACT inhaler Inhale 2 puffs into the lungs every 6 (six) hours as needed for wheezing or shortness of breath.  Marland Kitchen amlodipine-benazepril (LOTREL) 2.5-10 MG capsule Take 1 capsule by mouth daily.  Marland Kitchen aspirin EC 81 MG tablet Take 162 mg by mouth daily.    . flecainide (TAMBOCOR) 50 MG tablet Take 1 tablet (50 mg total) by mouth 2 (two) times daily.  . furosemide (LASIX) 40 MG tablet Take 1 tablet (40 mg total) by mouth daily. 11/09/2019 dose increase  . HYDROcodone-acetaminophen (NORCO/VICODIN) 5-325 MG tablet Take 1 tablet by mouth every 6 (six) hours as needed for moderate pain.  . metoprolol succinate (TOPROL-XL) 50 MG 24 hr tablet Take 50 mg by mouth 2 (two) times daily.  . potassium chloride (KLOR-CON) 10 MEQ tablet Take 1 tablet (10 mEq total) by mouth daily.     Allergies:   Lipitor [atorvastatin calcium]   Social History   Socioeconomic History  . Marital status: Married    Spouse name: Not on file  . Number of children: Not on file  . Years of education: Not on file  . Highest education level: Not on file  Occupational History  . Not on file  Tobacco Use  . Smoking status: Current Every Day Smoker  . Smokeless tobacco: Never Used  Substance and Sexual Activity  . Alcohol use: No  . Drug use: No  . Sexual activity: Yes  Other Topics Concern  . Not on file  Social History Narrative  . Not on file   Social Determinants of Health   Financial Resource Strain:   . Difficulty of Paying Living Expenses: Not on file  Food Insecurity:   . Worried About  Running Out of Food in the Last Year: Not on file  . Ran Out of Food in the Last Year: Not on file  Transportation Needs:   . Lack of Transportation (Medical): Not on file  . Lack of Transportation (Non-Medical): Not on file  Physical Activity:   . Days of Exercise per Week: Not on file  . Minutes of Exercise per Session: Not on file  Stress:   . Feeling of Stress : Not on file  Social Connections:   . Frequency of Communication with Friends and Family: Not on file  . Frequency of Social Gatherings with Friends and Family: Not on file  . Attends Religious Services: Not on file  . Active Member of Clubs or Organizations: Not on file  . Attends Archivist  Meetings: Not on file  . Marital Status: Not on file     Family History: The patient's family history includes Diabetes in her brother; Heart disease in her father and mother; Hypertension in her brother, father, and mother. ROS:   Please see the history of present illness.    All 14 point review of systems negative except as described per history of present illness  EKGs/Labs/Other Studies Reviewed:      Recent Labs: 11/06/2019: ALT 17; BUN 11; Creatinine, Ser 0.74; Hemoglobin 18.1; NT-Pro BNP 2,116; Platelets 169; Potassium 3.8; Sodium 144; TSH 2.350  Recent Lipid Panel    Component Value Date/Time   CHOL 157 09/12/2018 1103   TRIG 85 09/12/2018 1103   HDL 35 (L) 09/12/2018 1103   CHOLHDL 4.5 (H) 09/12/2018 1103   LDLCALC 105 (H) 09/12/2018 1103    Physical Exam:    VS:  BP (!) 142/88   Pulse 79   Temp 98 F (36.7 C)   Ht 5\' 1"  (1.549 m)   Wt 201 lb (91.2 kg)   LMP  (LMP Unknown)   SpO2 91%   BMI 37.98 kg/m     Wt Readings from Last 3 Encounters:  11/20/19 201 lb (91.2 kg)  11/13/19 196 lb 12.8 oz (89.3 kg)  11/06/19 202 lb (91.6 kg)     GEN:  Well nourished, well developed in no acute distress HEENT: Normal NECK: No JVD; No carotid bruits LYMPHATICS: No lymphadenopathy CARDIAC: RRR, soft systolic murmur grade 1/6 to 2/6 best heard right upper portion of the sternum, no rubs, no gallops RESPIRATORY:  Clear to auscultation without rales, wheezing or rhonchi  ABDOMEN: Soft, non-tender, non-distended MUSCULOSKELETAL:  No edema; No deformity  SKIN: Warm and dry LOWER EXTREMITIES: no swelling NEUROLOGIC:  Alert and oriented x 3 PSYCHIATRIC:  Normal affect   ASSESSMENT:    1. Aortic stenosis, mild   2. Benign essential hypertension   3. Paroxysmal atrial fibrillation (HCC)   4. Other congestive heart failure ([place])   5. Dyspnea on exertion    PLAN:    In order of problems listed above:  1. Aortic stenosis: Only mild, insignificant from hemodynamic point  of view. 2. Benign essential hypertension mildly elevated today we will check Chem-7 decide about therapy. 3. Paroxysmal atrial fibrillation maintaining sinus rhythm on flecainide, refused anticoagulation in spite of multiple discussion about it 4. Dyspnea exertion improved significantly.  Plan as outlined above proBNP Chem-7 will be checked today.  I see her back in my office in about a month   Medication Adjustments/Labs and Tests Ordered: Current medicines are reviewed at length with the patient today.  Concerns regarding medicines are outlined above.  Orders Placed  This Encounter  Procedures  . Basic metabolic panel  . Pro b natriuretic peptide (BNP)   Medication changes: No orders of the defined types were placed in this encounter.   Signed, Georgeanna Lea, MD, [facility] 11/20/2019 11:24 AM    [facility]

## 2019-11-20 NOTE — Patient Instructions (Signed)
Medication Instructions:  Your physician recommends that you continue on your current medications as directed. Please refer to the Current Medication list given to you today.  *If you need a refill on your cardiac medications before your next appointment, please call your pharmacy*   Lab Work: Your physician recommends that you return for lab work today: bmp, pro bnp  If you have labs (blood work) drawn today and your tests are completely normal, you will receive your results only by: . MyChart Message (if you have MyChart) OR . A paper copy in the mail If you have any lab test that is abnormal or we need to change your treatment, we will call you to review the results.   Testing/Procedures: None    Follow-Up: At [facility], you and your health needs are our priority.  As part of our continuing mission to provide you with exceptional heart care, we have created designated Provider Care Teams.  These Care Teams include your primary Cardiologist (physician) and Advanced Practice Providers (APPs -  Physician Assistants and Nurse Practitioners) who all work together to provide you with the care you need, when you need it.  We recommend signing up for the patient portal called "MyChart".  Sign up information is provided on this After Visit Summary.  MyChart is used to connect with patients for Virtual Visits (Telemedicine).  Patients are able to view lab/test results, encounter notes, upcoming appointments, etc.  Non-urgent messages can be sent to your provider as well.   To learn more about what you can do with MyChart, go to [email or web address].    Your next appointment:   1 month(s)  The format for your next appointment:   In Person  Provider:   Robert Krasowski, MD   Other Instructions   

## 2019-11-21 LAB — BASIC METABOLIC PANEL
BUN/Creatinine Ratio: 20 (ref 12–28)
BUN: 17 mg/dL (ref 8–27)
CO2: 26 mmol/L (ref 20–29)
Calcium: 9.7 mg/dL (ref 8.7–10.3)
Chloride: 105 mmol/L (ref 96–106)
Creatinine, Ser: 0.84 mg/dL (ref 0.57–1.00)
GFR calc Af Amer: 84 mL/min/{1.73_m2} (ref >59)
GFR calc non Af Amer: 73 mL/min/{1.73_m2} (ref >59)
Glucose: 113 mg/dL — ABNORMAL HIGH (ref 65–99)
Potassium: 4 mmol/L (ref 3.5–5.2)
Sodium: 147 mmol/L — ABNORMAL HIGH (ref 134–144)

## 2019-11-21 LAB — PRO B NATRIURETIC PEPTIDE: NT-Pro BNP: 1588 pg/mL — ABNORMAL HIGH (ref 0–301)

## 2019-11-22 ENCOUNTER — Telehealth: Payer: Self-pay

## 2019-11-22 NOTE — Telephone Encounter (Signed)
-----   Message from Georgeanna Lea, MD sent at 11/22/2019 11:15 AM EST ----- Blood test improved significantly.  proBNP for more than 2000 now is 1500, her kidney function is normal, potassium normal, continue present management.

## 2019-11-22 NOTE — Telephone Encounter (Signed)
The patient has been notified of the result and verbalized understanding.  All questions (if any) were answered. Leanord Hawking, RN 11/22/2019 4:28 PM

## 2019-12-26 ENCOUNTER — Ambulatory Visit: Payer: Medicare Other | Admitting: Cardiology

## 2020-01-01 ENCOUNTER — Ambulatory Visit (INDEPENDENT_AMBULATORY_CARE_PROVIDER_SITE_OTHER): Payer: Medicare Other | Admitting: Cardiology

## 2020-01-01 ENCOUNTER — Other Ambulatory Visit: Payer: Self-pay

## 2020-01-01 ENCOUNTER — Encounter: Payer: Self-pay | Admitting: Cardiology

## 2020-01-01 VITALS — BP 146/80 | HR 73 | Temp 97.9°F | Ht 62.0 in | Wt 201.6 lb

## 2020-01-01 DIAGNOSIS — I10 Essential (primary) hypertension: Secondary | ICD-10-CM | POA: Diagnosis not present

## 2020-01-01 DIAGNOSIS — I480 Paroxysmal atrial fibrillation: Secondary | ICD-10-CM | POA: Diagnosis not present

## 2020-01-01 DIAGNOSIS — I350 Nonrheumatic aortic (valve) stenosis: Secondary | ICD-10-CM

## 2020-01-01 DIAGNOSIS — J410 Simple chronic bronchitis: Secondary | ICD-10-CM

## 2020-01-01 DIAGNOSIS — I509 Heart failure, unspecified: Secondary | ICD-10-CM

## 2020-01-01 MED ORDER — FUROSEMIDE 20 MG PO TABS: 20.0000 mg | ORAL_TABLET | Freq: Every day | ORAL | 1 refills | Status: DC

## 2020-01-01 NOTE — Progress Notes (Signed)
Cardiology Office Note:    Date:  01/01/2020   ID:  Yuliya, Nova 05-14-1951, MRN [id number]  PCP:  Mateo Flow, MD  Cardiologist:  Jenne Campus, MD    Referring MD: Mateo Flow, MD   No chief complaint on file. Doing better  History of Present Illness:    Makayla Leach is a 69 y.o. female past medical history significant mild aortic stenosis, COPD, paroxysmal atrial fibrillation, on flecainide, refusing anticoagulation.  Comes today 2 months for follow-up.  Overall doing well.  She actually wanted to come down for diuretic.  I told her that she can watch her weight and that his weight is fine she can take only 20 mg of furosemide.  Also asked her to stop potassium the day that she takes only 20 mg of Lasix.  Denies have any palpitations.  No dizziness no passing out.  Shortness of breath improved.  Overall she is feeling better..  Past Medical History:  Diagnosis Date  . Aortic stenosis   . Aortic stenosis, mild 03/07/2016  . Benign essential hypertension 06/24/2015  . CHF (congestive heart failure) ([employer])   . COPD (chronic obstructive pulmonary disease) ([place])   . Diabetes mellitus without complication ([place])   . Hypertension   . LAE (left atrial enlargement)   . Left-sided epistaxis 01/14/2018  . Paroxysmal atrial fibrillation (HCC)   . Paroxysmal atrial flutter ([place]) 06/24/2015  . Peripheral vascular disease ([place])   . Smoking 06/24/2015  . Type 2 diabetes mellitus without complication ([place]) 63/04/4664    Past Surgical History:  Procedure Laterality Date  . TONSILLECTOMY    . TUBAL LIGATION      Current Medications: Current Meds  Medication Sig  . acetaminophen (TYLENOL) 500 MG tablet Take 500 mg by mouth every 8 (eight) hours as needed for mild pain.   Marland Kitchen albuterol (ACCUNEB) 0.63 MG/3ML nebulizer solution Take 1 ampule by nebulization every 6 (six) hours as needed for wheezing.  Marland Kitchen albuterol (PROVENTIL HFA;VENTOLIN HFA) 108 (90 Base) MCG/ACT inhaler Inhale 2  puffs into the lungs every 6 (six) hours as needed for wheezing or shortness of breath.  Marland Kitchen amlodipine-benazepril (LOTREL) 2.5-10 MG capsule Take 1 capsule by mouth daily.  Marland Kitchen aspirin EC 81 MG tablet Take 162 mg by mouth daily.  . flecainide (TAMBOCOR) 50 MG tablet Take 1 tablet (50 mg total) by mouth 2 (two) times daily.  . furosemide (LASIX) 20 MG tablet Take 1 tablet (20 mg total) by mouth daily. 11/09/2019 dose increase  . potassium chloride (KLOR-CON) 10 MEQ tablet Take 1 tablet (10 mEq total) by mouth daily.  . [DISCONTINUED] furosemide (LASIX) 40 MG tablet Take 1 tablet (40 mg total) by mouth daily. 11/09/2019 dose increase     Allergies:   Lipitor [atorvastatin calcium]   Social History   Socioeconomic History  . Marital status: Married    Spouse name: Not on file  . Number of children: Not on file  . Years of education: Not on file  . Highest education level: Not on file  Occupational History  . Not on file  Tobacco Use  . Smoking status: Current Every Day Smoker  . Smokeless tobacco: Never Used  Substance and Sexual Activity  . Alcohol use: No  . Drug use: No  . Sexual activity: Yes  Other Topics Concern  . Not on file  Social History Narrative  . Not on file   Social Determinants of Health   Financial Resource Strain:   .  Difficulty of Paying Living Expenses:   Food Insecurity:   . Worried About Programme researcher, broadcasting/film/video in the Last Year:   . Barista in the Last Year:   Transportation Needs:   . Freight forwarder (Medical):   Marland Kitchen Lack of Transportation (Non-Medical):   Physical Activity:   . Days of Exercise per Week:   . Minutes of Exercise per Session:   Stress:   . Feeling of Stress :   Social Connections:   . Frequency of Communication with Friends and Family:   . Frequency of Social Gatherings with Friends and Family:   . Attends Religious Services:   . Active Member of Clubs or Organizations:   . Attends Banker Meetings:   Marland Kitchen  Marital Status:      Family History: The patient's family history includes Diabetes in her brother; Heart disease in her father and mother; Hypertension in her brother, father, and mother. ROS:   Please see the history of present illness.    All 14 point review of systems negative except as described per history of present illness  EKGs/Labs/Other Studies Reviewed:      Recent Labs: 11/06/2019: ALT 17; Hemoglobin 18.1; Platelets 169; TSH 2.350 11/20/2019: BUN 17; Creatinine, Ser 0.84; NT-Pro BNP 1,588; Potassium 4.0; Sodium 147  Recent Lipid Panel    Component Value Date/Time   CHOL 157 09/12/2018 1103   TRIG 85 09/12/2018 1103   HDL 35 (L) 09/12/2018 1103   CHOLHDL 4.5 (H) 09/12/2018 1103   LDLCALC 105 (H) 09/12/2018 1103    Physical Exam:    VS:  BP (!) 146/80   Pulse 73   Temp 97.9 F (36.6 C)   Ht 5\' 2"  (1.575 m)   Wt 201 lb 9.6 oz (91.4 kg)   LMP  (LMP Unknown)   SpO2 94%   BMI 36.87 kg/m     Wt Readings from Last 3 Encounters:  01/01/20 201 lb 9.6 oz (91.4 kg)  11/20/19 201 lb (91.2 kg)  11/13/19 196 lb 12.8 oz (89.3 kg)     GEN:  Well nourished, well developed in no acute distress HEENT: Normal NECK: No JVD; No carotid bruits LYMPHATICS: No lymphadenopathy CARDIAC: RRR, no murmurs, no rubs, no gallops RESPIRATORY:  Clear to auscultation without rales, wheezing or rhonchi  ABDOMEN: Soft, non-tender, non-distended MUSCULOSKELETAL:  No edema; No deformity  SKIN: Warm and dry LOWER EXTREMITIES: no swelling NEUROLOGIC:  Alert and oriented x 3 PSYCHIATRIC:  Normal affect   ASSESSMENT:    1. Paroxysmal atrial fibrillation (HCC)   2. Other congestive heart failure (HCC)   3. Benign essential hypertension   4. Aortic stenosis, mild   5. Simple chronic bronchitis (HCC)    PLAN:    In order of problems listed above:  1. Paroxysmal atrial fibrillation refusing anticoagulation suppressed with flecainide 50 twice daily EKG showing right bundle branch block.   No worsening than before. 2. Congestive heart failure compensated now with diuretic.  She wants to come to the dose advised against this but she insisted.  We will try to reduce furosemide from 40 to 20 mg with instruction to take 40 if she gets swelling or shortness of breath. 3. Aortic stenosis: Mild. 4. Bronchitis.  Noted. 5. Dyslipidemia: I did review her K PN last fasting lipid profile at least on 2019 December showing LDL of 105.  We will make arrangements for fasting profile   Medication Adjustments/Labs and Tests Ordered: Current medicines are  reviewed at length with the patient today.  Concerns regarding medicines are outlined above.  Orders Placed This Encounter  Procedures  . EKG 12-Lead   Medication changes:  Meds ordered this encounter  Medications  . furosemide (LASIX) 20 MG tablet    Sig: Take 1 tablet (20 mg total) by mouth daily. 11/09/2019 dose increase    Dispense:  90 tablet    Refill:  1    Signed, Georgeanna Lea, MD, [facility] 01/01/2020 11:49 AM    [facility]

## 2020-01-01 NOTE — Patient Instructions (Signed)
Medication Instructions:  Your physician has recommended you make the following change in your medication:   TAKE: Lasix 20 mg daily   *If you need a refill on your cardiac medications before your next appointment, please call your pharmacy*   Lab Work: None.  If you have labs (blood work) drawn today and your tests are completely normal, you will receive your results only by: Marland Kitchen MyChart Message (if you have MyChart) OR . A paper copy in the mail If you have any lab test that is abnormal or we need to change your treatment, we will call you to review the results.   Testing/Procedures: None.    Follow-Up: At [facility], you and your health needs are our priority.  As part of our continuing mission to provide you with exceptional heart care, we have created designated Provider Care Teams.  These Care Teams include your primary Cardiologist (physician) and Advanced Practice Providers (APPs -  Physician Assistants and Nurse Practitioners) who all work together to provide you with the care you need, when you need it.  We recommend signing up for the patient portal called "MyChart".  Sign up information is provided on this After Visit Summary.  MyChart is used to connect with patients for Virtual Visits (Telemedicine).  Patients are able to view lab/test results, encounter notes, upcoming appointments, etc.  Non-urgent messages can be sent to your provider as well.   To learn more about what you can do with MyChart, go to [email or web address].    Your next appointment:   3 month(s)  The format for your next appointment:   In Person  Provider:   Gypsy Balsam, MD   Other Instructions

## 2020-01-02 ENCOUNTER — Telehealth: Payer: Self-pay | Admitting: Cardiology

## 2020-01-02 NOTE — Telephone Encounter (Signed)
Per office note from Dr. Bing Matter on 04/12 the patient's Lasix was decreased from 40 mg to 20 mg.  I spoke with Laurelyn Sickle at [facility] and advised her that 20 mg was the correct dose and that "dose increased" was put on the prescription by mistake and it should have stated that the dose was increased.

## 2020-01-02 NOTE — Telephone Encounter (Signed)
New Message     Pt c/o medication issue:  1. Name of Medication: furosemide (LASIX) 20 MG tablet  2. How are you currently taking this medication (dosage and times per day)? Pt was taking 40 mg   3. Are you having a reaction (difficulty breathing--STAT)? No   4. What is your medication issue? Laurelyn Sickle is calling from [facility] and says they received the refill and a note with the refill that said the medication had a dose increase, But she says the prescription was sent for 20 mg which is less then what she was getting She is wanting to clarify    Please call back

## 2020-03-07 ENCOUNTER — Telehealth: Payer: Self-pay | Admitting: Cardiology

## 2020-03-07 DIAGNOSIS — Z79899 Other long term (current) drug therapy: Secondary | ICD-10-CM

## 2020-03-07 NOTE — Telephone Encounter (Signed)
Attempted to call patient back no answer will continue efforts.  

## 2020-03-07 NOTE — Telephone Encounter (Signed)
Patient called back returning Hayley's Call 

## 2020-03-07 NOTE — Telephone Encounter (Signed)
Called patient informed her that Dr. Bing Matter would like her to have lab work drawn. Patient verbally understands and plans to come Monday. No further questions.

## 2020-03-07 NOTE — Telephone Encounter (Signed)
Lets recheck potassium this way we will know

## 2020-03-07 NOTE — Telephone Encounter (Signed)
New Message    Pt c/o medication issue:  1. Name of Medication: potassium chloride (KLOR-CON) 10 MEQ tablet(Expired)  2. How are you currently taking this medication (dosage and times per day)? 10 meq daily   3. Are you having a reaction (difficulty breathing--STAT)? No   4. What is your medication issue? Pt is wondering if she needs to continue taking taking this medication

## 2020-03-12 LAB — BASIC METABOLIC PANEL
BUN/Creatinine Ratio: 21 (ref 12–28)
BUN: 15 mg/dL (ref 8–27)
CO2: 24 mmol/L (ref 20–29)
Calcium: 9.6 mg/dL (ref 8.7–10.3)
Chloride: 106 mmol/L (ref 96–106)
Creatinine, Ser: 0.73 mg/dL (ref 0.57–1.00)
GFR calc Af Amer: 99 mL/min/{1.73_m2} (ref >59)
GFR calc non Af Amer: 85 mL/min/{1.73_m2} (ref >59)
Glucose: 115 mg/dL — ABNORMAL HIGH (ref 65–99)
Potassium: 4.2 mmol/L (ref 3.5–5.2)
Sodium: 145 mmol/L — ABNORMAL HIGH (ref 134–144)

## 2020-04-08 ENCOUNTER — Other Ambulatory Visit: Payer: Self-pay

## 2020-04-08 ENCOUNTER — Encounter: Payer: Self-pay | Admitting: Cardiology

## 2020-04-08 ENCOUNTER — Ambulatory Visit (INDEPENDENT_AMBULATORY_CARE_PROVIDER_SITE_OTHER): Payer: Medicare Other | Admitting: Cardiology

## 2020-04-08 VITALS — BP 192/98 | HR 76 | Ht 62.0 in | Wt 200.0 lb

## 2020-04-08 DIAGNOSIS — F17200 Nicotine dependence, unspecified, uncomplicated: Secondary | ICD-10-CM

## 2020-04-08 DIAGNOSIS — I480 Paroxysmal atrial fibrillation: Secondary | ICD-10-CM

## 2020-04-08 DIAGNOSIS — I10 Essential (primary) hypertension: Secondary | ICD-10-CM | POA: Diagnosis not present

## 2020-04-08 DIAGNOSIS — E119 Type 2 diabetes mellitus without complications: Secondary | ICD-10-CM

## 2020-04-08 DIAGNOSIS — IMO0001 Reserved for inherently not codable concepts without codable children: Secondary | ICD-10-CM

## 2020-04-08 DIAGNOSIS — I350 Nonrheumatic aortic (valve) stenosis: Secondary | ICD-10-CM | POA: Diagnosis not present

## 2020-04-08 MED ORDER — AMLODIPINE BESY-BENAZEPRIL HCL 5-20 MG PO CAPS: 1.0000 | ORAL_CAPSULE | Freq: Every day | ORAL | 3 refills | Status: DC

## 2020-04-08 NOTE — Patient Instructions (Addendum)
Medication Instructions:  Your physician has recommended you make the following change in your medication:   We changed your Lotrel dose to 5-20mg  daily.  *If you need a refill on your cardiac medications before your next appointment, please call your pharmacy*   Lab Work: Your physician recommends that you return for lab work in: Thursday 04/11/20. You can come Monday through Friday 8:30 am to 12:00 pm and 1:15 to 4:30. You do not need to make an appointment as the order has already been placed.    If you have labs (blood work) drawn today and your tests are completely normal, you will receive your results only by: Marland Kitchen MyChart Message (if you have MyChart) OR . A paper copy in the mail If you have any lab test that is abnormal or we need to change your treatment, we will call you to review the results.   Testing/Procedures: None ordered   Follow-Up: At [facility], you and your health needs are our priority.  As part of our continuing mission to provide you with exceptional heart care, we have created designated Provider Care Teams.  These Care Teams include your primary Cardiologist (physician) and Advanced Practice Providers (APPs -  Physician Assistants and Nurse Practitioners) who all work together to provide you with the care you need, when you need it.  We recommend signing up for the patient portal called "MyChart".  Sign up information is provided on this After Visit Summary.  MyChart is used to connect with patients for Virtual Visits (Telemedicine).  Patients are able to view lab/test results, encounter notes, upcoming appointments, etc.  Non-urgent messages can be sent to your provider as well.   To learn more about what you can do with MyChart, go to [email or web address].    Your next appointment:   3 month(s)  The format for your next appointment:   In Person  Provider:   Gypsy Balsam, MD   Other Instructions  Blood Pressure Record Sheet To take your  blood pressure, you will need a blood pressure machine. You can buy a blood pressure machine (blood pressure monitor) at your clinic, drug store, or online. When choosing one, consider:  An automatic monitor that has an arm cuff.  A cuff that wraps snugly around your upper arm. You should be able to fit only one finger between your arm and the cuff.  A device that stores blood pressure reading results.  Do not choose a monitor that measures your blood pressure from your wrist or finger. Follow your health care provider's instructions for how to take your blood pressure. To use this form:  Get one reading in the morning (a.m.) 1 to 2 hours after you take any medicine for your blood pressure.  Get one reading in the evening (p.m.) before supper.  Take at least 2 readings with each blood pressure check. This makes sure the results are correct. Wait 1-2 minutes between measurements.  Write down the results in the spaces on this form.  Repeat this once a week, or as told by your health care provider.  Make a follow-up appointment with your health care provider to discuss the results. Blood pressure log Date: _______________________  a.m. _____________________(1st reading) _____________________(2nd reading)  p.m. _____________________(1st reading) _____________________(2nd reading) Date: _______________________  a.m. _____________________(1st reading) _____________________(2nd reading)  p.m. _____________________(1st reading) _____________________(2nd reading) Date: _______________________  a.m. _____________________(1st reading) _____________________(2nd reading)  p.m. _____________________(1st reading) _____________________(2nd reading) Date: _______________________  a.m. _____________________(1st reading) _____________________(2nd reading)  p.m. _____________________(1st reading) _____________________(2nd  reading) Date: _______________________  a.m. _____________________(1st  reading) _____________________(2nd reading)  p.m. _____________________(1st reading) _____________________(2nd reading) This information is not intended to replace advice given to you by your health care provider. Make sure you discuss any questions you have with your health care provider. Document Revised: 11/05/2017 Document Reviewed: 09/07/2017 Elsevier Patient Education  2020 [employer].

## 2020-04-08 NOTE — Progress Notes (Signed)
Cardiology Office Note:    Date:  04/08/2020   ID:  Makayla Leach, Makayla Leach Jul 24, 1951, MRN [id number]  PCP:  Lise Auer, MD  Cardiologist:  Gypsy Balsam, MD    Referring MD: Lise Auer, MD   No chief complaint on file. Very stressful situation at home  History of Present Illness:    Makayla Leach is a 69 y.o. female with past medical history significant for paroxysmal atrial fibrillation, aortic stenosis only mild, COPD, diastolic congestive heart failure, essential hypertension.  Comes today to my office for follow-up.  Overall she is describing very stressful situation at home, her brother is in the hospital and being in the ICU a few times obviously she is very concerned about also recently she just lost her aunt.  Her blood pressure today is very elevated but overall she is feeling well.  She denies have any chest pain tightness squeezing pressure burning chest.  Past Medical History:  Diagnosis Date  . Aortic stenosis   . Aortic stenosis, mild 03/07/2016  . Benign essential hypertension 06/24/2015  . CHF (congestive heart failure) (HCC)   . COPD (chronic obstructive pulmonary disease) (HCC)   . Diabetes mellitus without complication (HCC)   . Hypertension   . LAE (left atrial enlargement)   . Left-sided epistaxis 01/14/2018  . Paroxysmal atrial fibrillation (HCC)   . Paroxysmal atrial flutter (HCC) 06/24/2015  . Peripheral vascular disease (HCC)   . Smoking 06/24/2015  . Type 2 diabetes mellitus without complication (HCC) 06/24/2015    Past Surgical History:  Procedure Laterality Date  . TONSILLECTOMY    . TUBAL LIGATION      Current Medications: Current Meds  Medication Sig  . acetaminophen (TYLENOL) 500 MG tablet Take 500 mg by mouth every 8 (eight) hours as needed for mild pain.   Marland Kitchen albuterol (ACCUNEB) 0.63 MG/3ML nebulizer solution Take 1 ampule by nebulization every 6 (six) hours as needed for wheezing.  Marland Kitchen albuterol (PROVENTIL HFA;VENTOLIN HFA) 108 (90  Base) MCG/ACT inhaler Inhale 2 puffs into the lungs every 6 (six) hours as needed for wheezing or shortness of breath.  Marland Kitchen amlodipine-benazepril (LOTREL) 2.5-10 MG capsule Take 1 capsule by mouth daily.  Marland Kitchen aspirin EC 81 MG tablet Take 162 mg by mouth daily.  . flecainide (TAMBOCOR) 50 MG tablet Take 1 tablet (50 mg total) by mouth 2 (two) times daily.  . furosemide (LASIX) 20 MG tablet Take 1 tablet (20 mg total) by mouth daily. 11/09/2019 dose increase  . metoprolol succinate (TOPROL-XL) 50 MG 24 hr tablet Take 50 mg by mouth 2 (two) times daily.  . potassium chloride (KLOR-CON) 10 MEQ tablet Take 1 tablet (10 mEq total) by mouth daily.     Allergies:   Lipitor [atorvastatin calcium]   Social History   Socioeconomic History  . Marital status: Married    Spouse name: Not on file  . Number of children: Not on file  . Years of education: Not on file  . Highest education level: Not on file  Occupational History  . Not on file  Tobacco Use  . Smoking status: Current Every Day Smoker  . Smokeless tobacco: Never Used  Vaping Use  . Vaping Use: Never used  Substance and Sexual Activity  . Alcohol use: No  . Drug use: No  . Sexual activity: Yes  Other Topics Concern  . Not on file  Social History Narrative  . Not on file   Social Determinants of Health  Financial Resource Strain:   . Difficulty of Paying Living Expenses:   Food Insecurity:   . Worried About Programme researcher, broadcasting/film/video in the Last Year:   . Barista in the Last Year:   Transportation Needs:   . Freight forwarder (Medical):   Marland Kitchen Lack of Transportation (Non-Medical):   Physical Activity:   . Days of Exercise per Week:   . Minutes of Exercise per Session:   Stress:   . Feeling of Stress :   Social Connections:   . Frequency of Communication with Friends and Family:   . Frequency of Social Gatherings with Friends and Family:   . Attends Religious Services:   . Active Member of Clubs or Organizations:   .  Attends Banker Meetings:   Marland Kitchen Marital Status:      Family History: The patient's family history includes Diabetes in her brother; Heart disease in her father and mother; Hypertension in her brother, father, and mother. ROS:   Please see the history of present illness.    All 14 point review of systems negative except as described per history of present illness  EKGs/Labs/Other Studies Reviewed:      Recent Labs: 11/06/2019: ALT 17; Hemoglobin 18.1; Platelets 169; TSH 2.350 11/20/2019: NT-Pro BNP 1,588 03/11/2020: BUN 15; Creatinine, Ser 0.73; Potassium 4.2; Sodium 145  Recent Lipid Panel    Component Value Date/Time   CHOL 157 09/12/2018 1103   TRIG 85 09/12/2018 1103   HDL 35 (L) 09/12/2018 1103   CHOLHDL 4.5 (H) 09/12/2018 1103   LDLCALC 105 (H) 09/12/2018 1103    Physical Exam:    VS:  BP (!) 222/90   Pulse 76   Ht 5\' 2"  (1.575 m)   Wt 200 lb (90.7 kg)   LMP  (LMP Unknown)   SpO2 94%   BMI 36.58 kg/m     Wt Readings from Last 3 Encounters:  04/08/20 200 lb (90.7 kg)  01/01/20 201 lb 9.6 oz (91.4 kg)  11/20/19 201 lb (91.2 kg)     GEN:  Well nourished, well developed in no acute distress HEENT: Normal NECK: No JVD; No carotid bruits LYMPHATICS: No lymphadenopathy CARDIAC: RRR, no murmurs, no rubs, no gallops RESPIRATORY:  Clear to auscultation without rales, wheezing or rhonchi  ABDOMEN: Soft, non-tender, non-distended MUSCULOSKELETAL:  No edema; No deformity  SKIN: Warm and dry LOWER EXTREMITIES: no swelling NEUROLOGIC:  Alert and oriented x 3 PSYCHIATRIC:  Normal affect   ASSESSMENT:    1. Paroxysmal atrial fibrillation (HCC)   2. Benign essential hypertension   3. Aortic stenosis, mild   4. Smoking   5. Type 2 diabetes mellitus without complication, without long-term current use of insulin (HCC)    PLAN:    In order of problems listed above:  1. Paroxysmal atrial fibrillation: Seems to be normal sinus rhythm.  She refused  anticoagulation again I brought the topic up and she told me she does not want to take anticoagulation because of nosebleed.  And I told her she put herself at risk of stroke and I told her what is her choice to either have a stroke or nosebleed for me choice is quite obvious I would go with anticoagulation.  However she is 1 hold on that for now.  I think I will be able to convince her hopefully next time to go on anticoagulation. 2. Benign essential hypertension obviously concerning.  I will recheck her blood pressure before she leaves the  room and most likely increase dose of Lotrel and will follow-up Chem-7. 3. Type 2 diabetes noted followed by internal medicine team. 4. Smoking obviously still a problem she understands she need to quit   Medication Adjustments/Labs and Tests Ordered: Current medicines are reviewed at length with the patient today.  Concerns regarding medicines are outlined above.  No orders of the defined types were placed in this encounter.  Medication changes: No orders of the defined types were placed in this encounter.   Signed, Georgeanna Lea, MD, [facility] 04/08/2020 3:01 PM    [facility]

## 2020-04-16 LAB — BASIC METABOLIC PANEL
BUN/Creatinine Ratio: 21 (ref 12–28)
BUN: 14 mg/dL (ref 8–27)
CO2: 21 mmol/L (ref 20–29)
Calcium: 9.4 mg/dL (ref 8.7–10.3)
Chloride: 104 mmol/L (ref 96–106)
Creatinine, Ser: 0.68 mg/dL (ref 0.57–1.00)
GFR calc Af Amer: 105 mL/min/{1.73_m2} (ref >59)
GFR calc non Af Amer: 91 mL/min/{1.73_m2} (ref >59)
Glucose: 116 mg/dL — ABNORMAL HIGH (ref 65–99)
Potassium: 4.1 mmol/L (ref 3.5–5.2)
Sodium: 145 mmol/L — ABNORMAL HIGH (ref 134–144)

## 2020-04-19 ENCOUNTER — Telehealth: Payer: Self-pay | Admitting: Cardiology

## 2020-04-19 NOTE — Telephone Encounter (Signed)
Pt called stating that she was returning phone call regarding her results.

## 2020-04-19 NOTE — Telephone Encounter (Signed)
Spoke to patient informed her of results.  

## 2020-05-22 ENCOUNTER — Other Ambulatory Visit: Payer: Self-pay | Admitting: Cardiology

## 2020-06-02 ENCOUNTER — Other Ambulatory Visit: Payer: Self-pay | Admitting: Cardiology

## 2020-07-08 ENCOUNTER — Ambulatory Visit: Payer: Medicare Other | Admitting: Cardiology

## 2020-07-10 ENCOUNTER — Ambulatory Visit: Payer: Medicare Other | Admitting: Cardiology

## 2020-08-30 DIAGNOSIS — I10 Essential (primary) hypertension: Secondary | ICD-10-CM | POA: Insufficient documentation

## 2020-08-30 DIAGNOSIS — I517 Cardiomegaly: Secondary | ICD-10-CM | POA: Insufficient documentation

## 2020-08-30 DIAGNOSIS — E119 Type 2 diabetes mellitus without complications: Secondary | ICD-10-CM | POA: Insufficient documentation

## 2020-08-30 DIAGNOSIS — I350 Nonrheumatic aortic (valve) stenosis: Secondary | ICD-10-CM | POA: Insufficient documentation

## 2020-09-01 ENCOUNTER — Other Ambulatory Visit: Payer: Self-pay | Admitting: Cardiology

## 2020-09-02 ENCOUNTER — Ambulatory Visit (INDEPENDENT_AMBULATORY_CARE_PROVIDER_SITE_OTHER): Payer: Medicare Other | Admitting: Cardiology

## 2020-09-02 ENCOUNTER — Encounter: Payer: Self-pay | Admitting: Cardiology

## 2020-09-02 ENCOUNTER — Other Ambulatory Visit: Payer: Self-pay

## 2020-09-02 VITALS — BP 188/96 | HR 74 | Ht 61.0 in | Wt 205.0 lb

## 2020-09-02 DIAGNOSIS — I480 Paroxysmal atrial fibrillation: Secondary | ICD-10-CM

## 2020-09-02 DIAGNOSIS — E119 Type 2 diabetes mellitus without complications: Secondary | ICD-10-CM

## 2020-09-02 DIAGNOSIS — I739 Peripheral vascular disease, unspecified: Secondary | ICD-10-CM | POA: Diagnosis not present

## 2020-09-02 DIAGNOSIS — I10 Essential (primary) hypertension: Secondary | ICD-10-CM

## 2020-09-02 NOTE — Patient Instructions (Signed)
Medication Instructions:  Your physician recommends that you continue on your current medications as directed. Please refer to the Current Medication list given to you today.  *If you need a refill on your cardiac medications before your next appointment, please call your pharmacy*   Lab Work: None. If you have labs (blood work) drawn today and your tests are completely normal, you will receive your results only by: . MyChart Message (if you have MyChart) OR . A paper copy in the mail If you have any lab test that is abnormal or we need to change your treatment, we will call you to review the results.   Testing/Procedures: Your physician has requested that you have an echocardiogram. Echocardiography is a painless test that uses sound waves to create images of your heart. It provides your doctor with information about the size and shape of your heart and how well your heart's chambers and valves are working. This procedure takes approximately one hour. There are no restrictions for this procedure.     Follow-Up: At [facility], you and your health needs are our priority.  As part of our continuing mission to provide you with exceptional heart care, we have created designated Provider Care Teams.  These Care Teams include your primary Cardiologist (physician) and Advanced Practice Providers (APPs -  Physician Assistants and Nurse Practitioners) who all work together to provide you with the care you need, when you need it.  We recommend signing up for the patient portal called "MyChart".  Sign up information is provided on this After Visit Summary.  MyChart is used to connect with patients for Virtual Visits (Telemedicine).  Patients are able to view lab/test results, encounter notes, upcoming appointments, etc.  Non-urgent messages can be sent to your provider as well.   To learn more about what you can do with MyChart, go to [email or web address].    Your next appointment:   3  month(s)  The format for your next appointment:   In Person  Provider:   Robert Krasowski, MD   Other Instructions   Echocardiogram An echocardiogram is a procedure that uses painless sound waves (ultrasound) to produce an image of the heart. Images from an echocardiogram can provide important information about:  Signs of coronary artery disease (CAD).  Aneurysm detection. An aneurysm is a weak or damaged part of an artery wall that bulges out from the normal force of blood pumping through the body.  Heart size and shape. Changes in the size or shape of the heart can be associated with certain conditions, including heart failure, aneurysm, and CAD.  Heart muscle function.  Heart valve function.  Signs of a past heart attack.  Fluid buildup around the heart.  Thickening of the heart muscle.  A tumor or infectious growth around the heart valves. Tell a health care provider about:  Any allergies you have.  All medicines you are taking, including vitamins, herbs, eye drops, creams, and over-the-counter medicines.  Any blood disorders you have.  Any surgeries you have had.  Any medical conditions you have.  Whether you are pregnant or may be pregnant. What are the risks? Generally, this is a safe procedure. However, problems may occur, including:  Allergic reaction to dye (contrast) that may be used during the procedure. What happens before the procedure? No specific preparation is needed. You may eat and drink normally. What happens during the procedure?   An IV tube may be inserted into one of your veins.  You may receive   contrast through this tube. A contrast is an injection that improves the quality of the pictures from your heart.  A gel will be applied to your chest.  A wand-like tool (transducer) will be moved over your chest. The gel will help to transmit the sound waves from the transducer.  The sound waves will harmlessly bounce off of your heart to  allow the heart images to be captured in real-time motion. The images will be recorded on a computer. The procedure may vary among health care providers and hospitals. What happens after the procedure?  You may return to your normal, everyday life, including diet, activities, and medicines, unless your health care provider tells you not to do that. Summary  An echocardiogram is a procedure that uses painless sound waves (ultrasound) to produce an image of the heart.  Images from an echocardiogram can provide important information about the size and shape of your heart, heart muscle function, heart valve function, and fluid buildup around your heart.  You do not need to do anything to prepare before this procedure. You may eat and drink normally.  After the echocardiogram is completed, you may return to your normal, everyday life, unless your health care provider tells you not to do that. This information is not intended to replace advice given to you by your health care provider. Make sure you discuss any questions you have with your health care provider. Document Revised: 12/29/2018 Document Reviewed: 10/10/2016 Elsevier Patient Education  2020 Elsevier Inc.   

## 2020-09-02 NOTE — Progress Notes (Signed)
Cardiology Office Note:    Date:  09/02/2020   ID:  Lamyah, Creed 1951-02-16, MRN [id number]  PCP:  Lise Auer, MD  Cardiologist:  Gypsy Balsam, MD    Referring MD: Lise Auer, MD   Chief Complaint  Patient presents with  . Follow-up  Doing better but multiple pneumonias  History of Present Illness:    Makayla Leach is a 69 y.o. female with past medical history significant for paroxysmal atrial fibrillation, aortic stenosis, COPD, multiple pneumonias, diastolic congestive heart rate, essential hypertension.  She comes today 2 months of follow-up.  A lot of tragedy strikes at home she lost several family members because of all different diseases.  Last time when I see her she just lost her sister on top of that her brother was in ICU and he also passed.  Obviously she is grieving around the.  She is here to my office with her daughter.  Denies have any chest pain tightness squeezing pressure burning chest, she did have repeated pneumonia and she is on oxygen all the time according to her daughter she cannot do much anytime she try to do something she gets severely short of breath.  Past Medical History:  Diagnosis Date  . Aortic stenosis   . Aortic stenosis, mild 03/07/2016  . Benign essential hypertension 06/24/2015  . CHF (congestive heart failure) (HCC)   . COPD (chronic obstructive pulmonary disease) (HCC)   . Diabetes mellitus without complication (HCC)   . Hypertension   . LAE (left atrial enlargement)   . Left-sided epistaxis 01/14/2018  . Paroxysmal atrial fibrillation (HCC)   . Paroxysmal atrial flutter (HCC) 06/24/2015  . Peripheral vascular disease (HCC)   . Smoking 06/24/2015  . Type 2 diabetes mellitus without complication (HCC) 06/24/2015    Past Surgical History:  Procedure Laterality Date  . TONSILLECTOMY    . TUBAL LIGATION      Current Medications: Current Meds  Medication Sig  . albuterol (ACCUNEB) 0.63 MG/3ML nebulizer solution Take 1  ampule by nebulization every 6 (six) hours as needed for wheezing.  Marland Kitchen albuterol (PROVENTIL HFA;VENTOLIN HFA) 108 (90 Base) MCG/ACT inhaler Inhale 2 puffs into the lungs every 6 (six) hours as needed for wheezing or shortness of breath.  Marland Kitchen amLODipine-benazepril (LOTREL) 5-20 MG capsule Take 1 capsule by mouth daily.  Marland Kitchen aspirin EC 81 MG tablet Take 162 mg by mouth daily.  . flecainide (TAMBOCOR) 50 MG tablet TAKE ONE TABLET TWICE DAILY  . furosemide (LASIX) 20 MG tablet Take 1 tablet (20 mg total) by mouth daily. 11/09/2019 dose increase  . metoprolol succinate (TOPROL-XL) 50 MG 24 hr tablet Take 50 mg by mouth 2 (two) times daily.  . potassium chloride (KLOR-CON) 10 MEQ tablet TAKE ONE TABLET EVERY DAY     Allergies:   Lipitor [atorvastatin calcium]   Social History   Socioeconomic History  . Marital status: Married    Spouse name: Not on file  . Number of children: Not on file  . Years of education: Not on file  . Highest education level: Not on file  Occupational History  . Not on file  Tobacco Use  . Smoking status: Current Every Day Smoker  . Smokeless tobacco: Never Used  Vaping Use  . Vaping Use: Never used  Substance and Sexual Activity  . Alcohol use: No  . Drug use: No  . Sexual activity: Yes  Other Topics Concern  . Not on file  Social History Narrative  .  Not on file   Social Determinants of Health   Financial Resource Strain: Not on file  Food Insecurity: Not on file  Transportation Needs: Not on file  Physical Activity: Not on file  Stress: Not on file  Social Connections: Not on file     Family History: The patient's family history includes Diabetes in her brother; Heart disease in her father and mother; Hypertension in her brother, father, and mother. ROS:   Please see the history of present illness.    All 14 point review of systems negative except as described per history of present illness  EKGs/Labs/Other Studies Reviewed:      Recent  Labs: 11/06/2019: ALT 17; Hemoglobin 18.1; Platelets 169; TSH 2.350 11/20/2019: NT-Pro BNP 1,588 04/15/2020: BUN 14; Creatinine, Ser 0.68; Potassium 4.1; Sodium 145  Recent Lipid Panel    Component Value Date/Time   CHOL 157 09/12/2018 1103   TRIG 85 09/12/2018 1103   HDL 35 (L) 09/12/2018 1103   CHOLHDL 4.5 (H) 09/12/2018 1103   LDLCALC 105 (H) 09/12/2018 1103    Physical Exam:    VS:  BP (!) 188/96 (BP Location: Left Arm, Patient Position: Sitting)   Pulse 74   Ht 5\' 1"  (1.549 m)   Wt 205 lb (93 kg)   LMP  (LMP Unknown)   SpO2 (!) 83%   BMI 38.73 kg/m     Wt Readings from Last 3 Encounters:  09/02/20 205 lb (93 kg)  04/08/20 200 lb (90.7 kg)  01/01/20 201 lb 9.6 oz (91.4 kg)     GEN:  Well nourished, well developed in no acute distress HEENT: Normal NECK: No JVD; No carotid bruits LYMPHATICS: No lymphadenopathy CARDIAC: RRR, systolic ejection murmur grade 2/6 to 3/6 best heard right upper portion of the sternum, no rubs, no gallops RESPIRATORY:  Clear to auscultation without rales, wheezing or rhonchi  ABDOMEN: Soft, non-tender, non-distended MUSCULOSKELETAL:  No edema; No deformity  SKIN: Warm and dry LOWER EXTREMITIES: no swelling NEUROLOGIC:  Alert and oriented x 3 PSYCHIATRIC:  Normal affect   ASSESSMENT:    1. Paroxysmal atrial fibrillation (HCC)   2. Primary hypertension   3. Type 2 diabetes mellitus without complication, without long-term current use of insulin (HCC)   4. Peripheral vascular disease (HCC)    PLAN:    In order of problems listed above:  1. Paroxysmal atrial fibrillation EKG done showed today sinus rhythm she got right bundle branch block she is taking flecainide however she has been flecainide at present dose which is only very small 50 mg twice daily for many years with no difficulty with this kind of EKG.  Therefore we will continue.  She refused anticoagulation.  The only thing she takes is aspirin. 2. We had a multiple discussion about  the issue of anticoagulation she still does not want to do it 3. Essential hypertension: Blood pressure seems to be elevated today but she is rushing to see her pulmonologist and I suspect that will make her blood pressure high.  We will continue monitoring. 4. Peripheral vascular disease stable. 5. Aortic stenosis only mild based on last echocardiogram which was done few years ago, she supposed to have echocardiogram done in February however it did not happen.  We will make sure she will get it.   Medication Adjustments/Labs and Tests Ordered: Current medicines are reviewed at length with the patient today.  Concerns regarding medicines are outlined above.  No orders of the defined types were placed in this  encounter.  Medication changes: No orders of the defined types were placed in this encounter.   Signed, Georgeanna Lea, MD, [facility] 09/02/2020 2:56 PM    [facility]

## 2020-10-07 ENCOUNTER — Other Ambulatory Visit: Payer: Medicare Other

## 2020-11-04 ENCOUNTER — Ambulatory Visit (INDEPENDENT_AMBULATORY_CARE_PROVIDER_SITE_OTHER): Payer: Medicare Other

## 2020-11-04 ENCOUNTER — Other Ambulatory Visit: Payer: Self-pay

## 2020-11-04 DIAGNOSIS — I480 Paroxysmal atrial fibrillation: Secondary | ICD-10-CM | POA: Diagnosis not present

## 2020-11-04 DIAGNOSIS — E119 Type 2 diabetes mellitus without complications: Secondary | ICD-10-CM | POA: Diagnosis not present

## 2020-11-04 DIAGNOSIS — I739 Peripheral vascular disease, unspecified: Secondary | ICD-10-CM | POA: Diagnosis not present

## 2020-11-04 DIAGNOSIS — I10 Essential (primary) hypertension: Secondary | ICD-10-CM

## 2020-11-04 LAB — ECHOCARDIOGRAM COMPLETE
AR max vel: 0.97 cm2
AV Area VTI: 0.99 cm2
AV Area mean vel: 0.88 cm2
AV Mean grad: 17 mmHg
AV Peak grad: 28.5 mmHg
Ao pk vel: 2.67 m/s
MV VTI: 1.16 cm2
S' Lateral: 3.5 cm

## 2020-11-04 NOTE — Progress Notes (Unsigned)
Complete echocardiogram performed.  Jimmy Jianni Batten RDCS, RVT  

## 2020-11-08 ENCOUNTER — Telehealth: Payer: Self-pay | Admitting: Cardiology

## 2020-11-08 NOTE — Telephone Encounter (Signed)
Follow Up:    Pt is returning Makayla Leach call from yesterday, concerning her Echo results.

## 2020-11-11 NOTE — Telephone Encounter (Signed)
Left message for patient to return call.

## 2020-11-14 NOTE — Telephone Encounter (Signed)
Left message for patient to return call this morning.

## 2020-11-15 NOTE — Telephone Encounter (Addendum)
Patient informed of results.  

## 2020-12-06 ENCOUNTER — Other Ambulatory Visit: Payer: Self-pay

## 2020-12-06 DIAGNOSIS — J441 Chronic obstructive pulmonary disease with (acute) exacerbation: Secondary | ICD-10-CM | POA: Insufficient documentation

## 2020-12-06 DIAGNOSIS — J449 Chronic obstructive pulmonary disease, unspecified: Secondary | ICD-10-CM | POA: Insufficient documentation

## 2020-12-09 ENCOUNTER — Encounter: Payer: Self-pay | Admitting: Cardiology

## 2020-12-09 ENCOUNTER — Other Ambulatory Visit: Payer: Self-pay

## 2020-12-09 ENCOUNTER — Ambulatory Visit (INDEPENDENT_AMBULATORY_CARE_PROVIDER_SITE_OTHER): Payer: Medicare Other | Admitting: Cardiology

## 2020-12-09 VITALS — BP 172/88 | HR 94 | Ht 61.5 in | Wt 206.0 lb

## 2020-12-09 DIAGNOSIS — R0789 Other chest pain: Secondary | ICD-10-CM

## 2020-12-09 DIAGNOSIS — I480 Paroxysmal atrial fibrillation: Secondary | ICD-10-CM

## 2020-12-09 DIAGNOSIS — I509 Heart failure, unspecified: Secondary | ICD-10-CM | POA: Diagnosis not present

## 2020-12-09 DIAGNOSIS — I10 Essential (primary) hypertension: Secondary | ICD-10-CM

## 2020-12-09 DIAGNOSIS — I517 Cardiomegaly: Secondary | ICD-10-CM

## 2020-12-09 NOTE — Progress Notes (Signed)
Cardiology Office Note:    Date:  12/09/2020   ID:  Finlee, Concepcion 10-03-1950, MRN [id number]  PCP:  Lise Auer, MD  Cardiologist:  Gypsy Balsam, MD    Referring MD: Lise Auer, MD   Chief Complaint  Patient presents with  . Results  I am doing better  History of Present Illness:    Makayla Leach is a 70 y.o. female with past medical history significant for proximal mitral fibrillation, essential hypertension, peripheral vascular disease, aortic stenosis which is mild to moderate. Comes to my office for follow-up overall she said she is doing better.  Sadly still continues to smoke.  She still works.  She complained of having some pain in the left side of her chest located typically in the morning.  When the day progresses she gets better.  There is no exertional chest pain tightness squeezing pressure burning chest.  She points 1 location the left side she said massaging this area will help.  Past Medical History:  Diagnosis Date  . Aortic stenosis   . Aortic stenosis, mild 03/07/2016  . Benign essential hypertension 06/24/2015  . CHF (congestive heart failure) (HCC)   . COPD (chronic obstructive pulmonary disease) (HCC)   . Diabetes mellitus without complication (HCC)   . Hypertension   . LAE (left atrial enlargement)   . Left-sided epistaxis 01/14/2018  . Paroxysmal atrial fibrillation (HCC)   . Paroxysmal atrial flutter (HCC) 06/24/2015  . Peripheral vascular disease (HCC)   . Smoking 06/24/2015  . Type 2 diabetes mellitus without complication (HCC) 06/24/2015    Past Surgical History:  Procedure Laterality Date  . TONSILLECTOMY    . TUBAL LIGATION      Current Medications: Current Meds  Medication Sig  . albuterol (ACCUNEB) 0.63 MG/3ML nebulizer solution Take 1 ampule by nebulization every 6 (six) hours as needed for wheezing.  Marland Kitchen albuterol (PROVENTIL HFA;VENTOLIN HFA) 108 (90 Base) MCG/ACT inhaler Inhale 2 puffs into the lungs every 6 (six) hours as  needed for wheezing or shortness of breath.  Marland Kitchen amLODipine-benazepril (LOTREL) 5-20 MG capsule TAKE ONE CAPSULE EVERY DAY  . aspirin EC 81 MG tablet Take 162 mg by mouth daily.  . flecainide (TAMBOCOR) 50 MG tablet TAKE ONE TABLET TWICE DAILY  . furosemide (LASIX) 40 MG tablet Take 40 mg by mouth every morning.  . metoprolol succinate (TOPROL-XL) 50 MG 24 hr tablet Take 50 mg by mouth 2 (two) times daily.     Allergies:   Lipitor [atorvastatin calcium]   Social History   Socioeconomic History  . Marital status: Married    Spouse name: Not on file  . Number of children: Not on file  . Years of education: Not on file  . Highest education level: Not on file  Occupational History  . Not on file  Tobacco Use  . Smoking status: Current Every Day Smoker  . Smokeless tobacco: Never Used  Vaping Use  . Vaping Use: Never used  Substance and Sexual Activity  . Alcohol use: No  . Drug use: No  . Sexual activity: Yes  Other Topics Concern  . Not on file  Social History Narrative  . Not on file   Social Determinants of Health   Financial Resource Strain: Not on file  Food Insecurity: Not on file  Transportation Needs: Not on file  Physical Activity: Not on file  Stress: Not on file  Social Connections: Not on file     Family History:  The patient's family history includes Diabetes in her brother; Heart disease in her father and mother; Hypertension in her brother, father, and mother. ROS:   Please see the history of present illness.    All 14 point review of systems negative except as described per history of present illness  EKGs/Labs/Other Studies Reviewed:      Recent Labs: 04/15/2020: BUN 14; Creatinine, Ser 0.68; Potassium 4.1; Sodium 145  Recent Lipid Panel    Component Value Date/Time   CHOL 157 09/12/2018 1103   TRIG 85 09/12/2018 1103   HDL 35 (L) 09/12/2018 1103   CHOLHDL 4.5 (H) 09/12/2018 1103   LDLCALC 105 (H) 09/12/2018 1103    Physical Exam:    VS:   BP (!) 172/88 (BP Location: Right Arm, Patient Position: Sitting)   Pulse 94   Ht 5' 1.5" (1.562 m)   Wt 206 lb (93.4 kg)   LMP  (LMP Unknown)   SpO2 92%   BMI 38.29 kg/m     Wt Readings from Last 3 Encounters:  12/09/20 206 lb (93.4 kg)  09/02/20 205 lb (93 kg)  04/08/20 200 lb (90.7 kg)     GEN:  Well nourished, well developed in no acute distress HEENT: Normal NECK: No JVD; No carotid bruits LYMPHATICS: No lymphadenopathy CARDIAC: RRR, systolic ejection murmur grade 2/6 best heard right upper portion of the sternum, no rubs, no gallops RESPIRATORY: Poor entry bilateral some rhonchi ABDOMEN: Soft, non-tender, non-distended MUSCULOSKELETAL:  No edema; No deformity  SKIN: Warm and dry LOWER EXTREMITIES: no swelling NEUROLOGIC:  Alert and oriented x 3 PSYCHIATRIC:  Normal affect   ASSESSMENT:    1. Benign essential hypertension   2. Paroxysmal atrial fibrillation (HCC)   3. LAE (left atrial enlargement)   4. Primary hypertension   5. Other congestive heart failure (HCC)   6. Atypical chest pain    PLAN:    In order of problems listed above:  1. Benign essential hypertension blood pressure control continue present medications. 2. Paroxysmal atrial fibrillation on flecainide, refusing anticoagulation, EKG today shows sinus rhythm with right bundle branch block which is chronic.  Will concentrate on a rhythm control. 3. Atypical chest pain.  We had a long discussion about what to do with the situation I offered her some testing however she said she want to wait and see how situational progress.  Told her to let me know if she got more episode of chest pain especially when pain became related to exercise. 4. COPD which is advanced, sadly she still continues to smoke.  Again she was told that she need to quit. 5. Aortic stenosis mild to moderate.  We will continue monitoring. 6. Ejection fraction diminished 50%.  She is already on ACE inhibitor which I will continue also on  beta-blocker I will continue.   Medication Adjustments/Labs and Tests Ordered: Current medicines are reviewed at length with the patient today.  Concerns regarding medicines are outlined above.  Orders Placed This Encounter  Procedures  . Lipid panel  . EKG 12-Lead   Medication changes: No orders of the defined types were placed in this encounter.   Signed, Georgeanna Lea, MD, [facility] 12/09/2020 1:46 PM    [facility]

## 2020-12-09 NOTE — Patient Instructions (Signed)
Medication Instructions:  Your physician recommends that you continue on your current medications as directed. Please refer to the Current Medication list given to you today.  *If you need a refill on your cardiac medications before your next appointment, please call your pharmacy*   Lab Work: Your physician recommends that you return for lab work today: lipids   If you have labs (blood work) drawn today and your tests are completely normal, you will receive your results only by: Marland Kitchen MyChart Message (if you have MyChart) OR . A paper copy in the mail If you have any lab test that is abnormal or we need to change your treatment, we will call you to review the results.   Testing/Procedures: None   Follow-Up: At [facility], you and your health needs are our priority.  As part of our continuing mission to provide you with exceptional heart care, we have created designated Provider Care Teams.  These Care Teams include your primary Cardiologist (physician) and Advanced Practice Providers (APPs -  Physician Assistants and Nurse Practitioners) who all work together to provide you with the care you need, when you need it.  We recommend signing up for the patient portal called "MyChart".  Sign up information is provided on this After Visit Summary.  MyChart is used to connect with patients for Virtual Visits (Telemedicine).  Patients are able to view lab/test results, encounter notes, upcoming appointments, etc.  Non-urgent messages can be sent to your provider as well.   To learn more about what you can do with MyChart, go to [email or web address].    Your next appointment:   3 month(s)  The format for your next appointment:   In Person  Provider:   Gypsy Balsam, MD   Other Instructions

## 2020-12-10 LAB — LIPID PANEL
Chol/HDL Ratio: 4.7 ratio — ABNORMAL HIGH (ref 0.0–4.4)
Cholesterol, Total: 161 mg/dL (ref 100–199)
HDL: 34 mg/dL — ABNORMAL LOW (ref >39)
LDL Chol Calc (NIH): 102 mg/dL — ABNORMAL HIGH (ref 0–99)
Triglycerides: 139 mg/dL (ref 0–149)
VLDL Cholesterol Cal: 25 mg/dL (ref 5–40)

## 2020-12-12 ENCOUNTER — Telehealth: Payer: Self-pay | Admitting: Emergency Medicine

## 2020-12-12 DIAGNOSIS — E7800 Pure hypercholesterolemia, unspecified: Secondary | ICD-10-CM

## 2020-12-12 MED ORDER — EZETIMIBE 10 MG PO TABS: 10.0000 mg | ORAL_TABLET | Freq: Every day | ORAL | 1 refills | Status: DC

## 2020-12-12 NOTE — Telephone Encounter (Signed)
Called patient. She reports she does not want to take any statins she has tried them in the past and she cannot take due to muscle aches. Will informed Dr. Bing Matter.

## 2020-12-12 NOTE — Telephone Encounter (Signed)
Called patient. Informed her of Dr. Vanetta Shawl recommendation. She will try zetia 10 mg daily. She will come in 6 weeks to have labs redrawn.

## 2020-12-12 NOTE — Addendum Note (Signed)
Addended by: Hazle Quant on: 12/12/2020 11:37 AM   Modules accepted: Orders

## 2020-12-12 NOTE — Telephone Encounter (Signed)
I asked her if she would be willing to try Zetia 10 mg daily this is not statin and hopefully should be able to tolerate it if she agrees 6 weeks later she did have fasting lipid profile done

## 2020-12-12 NOTE — Telephone Encounter (Signed)
-----   Message from Georgeanna Lea, MD sent at 12/11/2020 10:55 AM EDT ----- Cholesterol not good, need to be controlled.  Recommend to start Lipitor 10 mg daily, fasting lipid profile AST ALT need to be repeated 6 weeks

## 2020-12-17 ENCOUNTER — Other Ambulatory Visit: Payer: Self-pay | Admitting: Cardiology

## 2021-01-13 ENCOUNTER — Other Ambulatory Visit: Payer: Self-pay | Admitting: Cardiology

## 2021-01-21 ENCOUNTER — Telehealth: Payer: Self-pay | Admitting: Cardiology

## 2021-01-21 NOTE — Telephone Encounter (Signed)
Pt c/o of Chest Pain: STAT if CP now or developed within 24 hours  1. Are you having CP right now?  Yes   2. Are you experiencing any other symptoms (ex. SOB, nausea, vomiting, sweating)?  No   3. How long have you been experiencing CP?  About 1 week  4. Is your CP continuous or coming and going?  Coming and going, on left side of chest  5. Have you taken Nitroglycerin?  ?No

## 2021-01-21 NOTE — Telephone Encounter (Signed)
Left message on patients voicemail to please return our call.   

## 2021-01-21 NOTE — Telephone Encounter (Signed)
Spoke to the patient just now and she let me know that she is having chest pain that started last week. She states that when she takes her zetia she will have chest pain. She is not having any chest pain unless she takes the medication. She states that she is trying to stop smoking but she has been smoking a lot more then normal the past week. Denies any SOB, Dizziness, or N/V.  She does not have any vital signs for me at this time and she is not able to take them at home. She would like recommendations from Dr. Bing Matter.   I advised that if her chest pain were to come back and did not seem to be caused by her medications that she should notify us and if the pain did not go away or was worse then she should proceed to the ED.

## 2021-01-22 NOTE — Telephone Encounter (Signed)
Left message on patients voicemail to please return our call.   

## 2021-01-23 NOTE — Telephone Encounter (Signed)
Called patient advised her to stop zetia. She understood. No further questions.

## 2021-02-03 DIAGNOSIS — E7800 Pure hypercholesterolemia, unspecified: Secondary | ICD-10-CM | POA: Diagnosis not present

## 2021-02-03 LAB — LIPID PANEL
Chol/HDL Ratio: 4.7 ratio — ABNORMAL HIGH (ref 0.0–4.4)
Cholesterol, Total: 168 mg/dL (ref 100–199)
HDL: 36 mg/dL — ABNORMAL LOW
LDL Chol Calc (NIH): 110 mg/dL — ABNORMAL HIGH (ref 0–99)
Triglycerides: 119 mg/dL (ref 0–149)
VLDL Cholesterol Cal: 22 mg/dL (ref 5–40)

## 2021-02-07 ENCOUNTER — Telehealth: Payer: Self-pay | Admitting: Emergency Medicine

## 2021-02-07 MED ORDER — NEXLETOL 180 MG PO TABS: 180.0000 mg | ORAL_TABLET | Freq: Every day | ORAL | 1 refills | Status: DC

## 2021-02-07 NOTE — Telephone Encounter (Signed)
-----   Message from Georgeanna Lea, MD sent at 02/06/2021  8:48 PM EDT ----- Cholesterol still elevated I suggest to start Nexletol 180 mg daily

## 2021-02-07 NOTE — Telephone Encounter (Signed)
Called patient informed her of results. Advised her to start nexletol 180 mg daily as Dr. Bing Matter reccomends she understood. RX sent in.

## 2021-02-12 ENCOUNTER — Telehealth: Payer: Self-pay | Admitting: Cardiology

## 2021-02-12 NOTE — Telephone Encounter (Signed)
Spoke with pharmacy tech, they did not send or fax PA info to initiate PA. Pharmacy is faxing information to confirmed fax number.

## 2021-02-12 NOTE — Telephone Encounter (Signed)
SAM FROM [facility] CALLING IN TO SEE IF PA REQ WAS RECEIVED FOR Nexletol Bempedoic Acid (NEXLETOL) 180 MG TABS.

## 2021-02-13 NOTE — Telephone Encounter (Signed)
Left a message with pharmacy still have not receive PA form.

## 2021-02-14 NOTE — Telephone Encounter (Signed)
I called the pharmacy again and LM to return my call regarding PA.

## 2021-02-21 NOTE — Telephone Encounter (Signed)
Unable to reach the pharmacy team for information to where PA needs to be filed

## 2021-02-25 ENCOUNTER — Telehealth: Payer: Self-pay

## 2021-02-25 NOTE — Telephone Encounter (Signed)
PA started on CMM for Nexletol 180 mg. Key [id number]. PA approved from 02-25-21 until 02-25-22.

## 2021-04-14 ENCOUNTER — Other Ambulatory Visit: Payer: Self-pay

## 2021-04-14 ENCOUNTER — Ambulatory Visit: Payer: Medicare Other | Admitting: Cardiology

## 2021-04-14 ENCOUNTER — Encounter: Payer: Self-pay | Admitting: Cardiology

## 2021-04-14 VITALS — BP 180/70 | HR 79 | Ht 61.5 in | Wt 212.0 lb

## 2021-04-14 DIAGNOSIS — I350 Nonrheumatic aortic (valve) stenosis: Secondary | ICD-10-CM

## 2021-04-14 DIAGNOSIS — E119 Type 2 diabetes mellitus without complications: Secondary | ICD-10-CM

## 2021-04-14 DIAGNOSIS — E1169 Type 2 diabetes mellitus with other specified complication: Secondary | ICD-10-CM | POA: Diagnosis not present

## 2021-04-14 DIAGNOSIS — I480 Paroxysmal atrial fibrillation: Secondary | ICD-10-CM

## 2021-04-14 DIAGNOSIS — I5042 Chronic combined systolic (congestive) and diastolic (congestive) heart failure: Secondary | ICD-10-CM

## 2021-04-14 DIAGNOSIS — E782 Mixed hyperlipidemia: Secondary | ICD-10-CM | POA: Diagnosis not present

## 2021-04-14 DIAGNOSIS — E7800 Pure hypercholesterolemia, unspecified: Secondary | ICD-10-CM | POA: Diagnosis not present

## 2021-04-14 DIAGNOSIS — I10 Essential (primary) hypertension: Secondary | ICD-10-CM

## 2021-04-14 DIAGNOSIS — J441 Chronic obstructive pulmonary disease with (acute) exacerbation: Secondary | ICD-10-CM | POA: Diagnosis not present

## 2021-04-14 DIAGNOSIS — F17200 Nicotine dependence, unspecified, uncomplicated: Secondary | ICD-10-CM

## 2021-04-14 DIAGNOSIS — J449 Chronic obstructive pulmonary disease, unspecified: Secondary | ICD-10-CM | POA: Diagnosis not present

## 2021-04-14 DIAGNOSIS — IMO0001 Reserved for inherently not codable concepts without codable children: Secondary | ICD-10-CM

## 2021-04-14 NOTE — Patient Instructions (Signed)
Medication Instructions:  Your physician recommends that you continue on your current medications as directed. Please refer to the Current Medication list given to you today.  *If you need a refill on your cardiac medications before your next appointment, please call your pharmacy*   Lab Work: NONE If you have labs (blood work) drawn today and your tests are completely normal, you will receive your results only by: MyChart Message (if you have MyChart) OR A paper copy in the mail If you have any lab test that is abnormal or we need to change your treatment, we will call you to review the results.   Testing/Procedures: NONE   Follow-Up: At [facility], you and your health needs are our priority.  As part of our continuing mission to provide you with exceptional heart care, we have created designated Provider Care Teams.  These Care Teams include your primary Cardiologist (physician) and Advanced Practice Providers (APPs -  Physician Assistants and Nurse Practitioners) who all work together to provide you with the care you need, when you need it.  We recommend signing up for the patient portal called "MyChart".  Sign up information is provided on this After Visit Summary.  MyChart is used to connect with patients for Virtual Visits (Telemedicine).  Patients are able to view lab/test results, encounter notes, upcoming appointments, etc.  Non-urgent messages can be sent to your provider as well.   To learn more about what you can do with MyChart, go to [email or web address].    Your next appointment:   5 month(s)  The format for your next appointment:   In Person  Provider:   Robert Krasowski, MD    Other Instructions   

## 2021-04-14 NOTE — Progress Notes (Signed)
Cardiology Office Note:    Date:  04/14/2021   ID:  Amri, Lien 1951-05-06, MRN [id number]  PCP:  Lise Auer, MD  Cardiologist:  Gypsy Balsam, MD    Referring MD: Lise Auer, MD   Chief Complaint  Patient presents with   Follow-up    History of Present Illness:    Makayla Leach is a 70 y.o. female with past medical history significant for paroxysmal atrial fibrillation, essential hypertension, peripheral vascular disease, aortic stenosis which is mild to moderate.  Sadly she still continues to smoke.  She comes today to my office for follow-up.  She complained of having cold-like symptoms for the last 3 weeks but gradually getting better.  Denies have any chest pain tightness squeezing pressure burning chest no palpitations no dizziness no swelling of lower extremities.  Past Medical History:  Diagnosis Date   Aortic stenosis    Aortic stenosis, mild 03/07/2016   Benign essential hypertension 06/24/2015   CHF (congestive heart failure) (HCC)    COPD (chronic obstructive pulmonary disease) (HCC)    Diabetes mellitus without complication (HCC)    Hypertension    LAE (left atrial enlargement)    Left-sided epistaxis 01/14/2018   Paroxysmal atrial fibrillation (HCC)    Paroxysmal atrial flutter (HCC) 06/24/2015   Peripheral vascular disease (HCC)    Smoking 06/24/2015   Type 2 diabetes mellitus without complication (HCC) 06/24/2015    Past Surgical History:  Procedure Laterality Date   TONSILLECTOMY     TUBAL LIGATION      Current Medications: Current Meds  Medication Sig   albuterol (ACCUNEB) 0.63 MG/3ML nebulizer solution Take 1 ampule by nebulization every 6 (six) hours as needed for wheezing.   albuterol (PROVENTIL HFA;VENTOLIN HFA) 108 (90 Base) MCG/ACT inhaler Inhale 2 puffs into the lungs every 6 (six) hours as needed for wheezing or shortness of breath.   amLODipine-benazepril (LOTREL) 5-20 MG capsule TAKE ONE CAPSULE EVERY DAY (Patient taking  differently: Take 1 capsule by mouth daily.)   aspirin EC 81 MG tablet Take 162 mg by mouth daily.   flecainide (TAMBOCOR) 50 MG tablet TAKE ONE TABLET TWICE DAILY (Patient taking differently: Take 50 mg by mouth 2 (two) times daily.)   furosemide (LASIX) 40 MG tablet Take 40 mg by mouth every morning.   metoprolol succinate (TOPROL-XL) 50 MG 24 hr tablet Take 50 mg by mouth 2 (two) times daily.   potassium chloride (KLOR-CON) 10 MEQ tablet TAKE ONE TABLET EVERY DAY (Patient taking differently: Take 10 mEq by mouth daily.)     Allergies:   Lipitor [atorvastatin calcium]   Social History   Socioeconomic History   Marital status: Married    Spouse name: Not on file   Number of children: Not on file   Years of education: Not on file   Highest education level: Not on file  Occupational History   Not on file  Tobacco Use   Smoking status: Every Day   Smokeless tobacco: Never  Vaping Use   Vaping Use: Never used  Substance and Sexual Activity   Alcohol use: No   Drug use: No   Sexual activity: Yes  Other Topics Concern   Not on file  Social History Narrative   Not on file   Social Determinants of Health   Financial Resource Strain: Not on file  Food Insecurity: Not on file  Transportation Needs: Not on file  Physical Activity: Not on file  Stress: Not on file  Social Connections: Not on file     Family History: The patient's family history includes Diabetes in her brother; Heart disease in her father and mother; Hypertension in her brother, father, and mother. ROS:   Please see the history of present illness.    All 14 point review of systems negative except as described per history of present illness  EKGs/Labs/Other Studies Reviewed:      Recent Labs: 04/15/2020: BUN 14; Creatinine, Ser 0.68; Potassium 4.1; Sodium 145  Recent Lipid Panel    Component Value Date/Time   CHOL 168 02/03/2021 1036   TRIG 119 02/03/2021 1036   HDL 36 (L) 02/03/2021 1036   CHOLHDL  4.7 (H) 02/03/2021 1036   LDLCALC 110 (H) 02/03/2021 1036    Physical Exam:    VS:  BP (!) 180/70 (BP Location: Left Arm, Patient Position: Sitting)   Pulse 79   Ht 5' 1.5" (1.562 m)   Wt 212 lb (96.2 kg)   LMP  (LMP Unknown)   SpO2 97%   BMI 39.41 kg/m     Wt Readings from Last 3 Encounters:  04/14/21 212 lb (96.2 kg)  12/09/20 206 lb (93.4 kg)  09/02/20 205 lb (93 kg)     GEN:  Well nourished, well developed in no acute distress HEENT: Normal NECK: No JVD; No carotid bruits LYMPHATICS: No lymphadenopathy CARDIAC: RRR, systolic ejection murmur grade 3/6 best heard right upper portion of the sternum, no rubs, no gallops RESPIRATORY:  Clear to auscultation without rales, wheezing or rhonchi  ABDOMEN: Soft, non-tender, non-distended MUSCULOSKELETAL:  No edema; No deformity  SKIN: Warm and dry LOWER EXTREMITIES: no swelling NEUROLOGIC:  Alert and oriented x 3 PSYCHIATRIC:  Normal affect   ASSESSMENT:    1. Paroxysmal atrial fibrillation (HCC)   2. Aortic stenosis, mild   3. High cholesterol   4. Benign essential hypertension   5. Chronic combined systolic and diastolic congestive heart failure (HCC)   6. Type 2 diabetes mellitus without complication, without long-term current use of insulin (HCC)   7. Smoking    PLAN:    In order of problems listed above:  Paroxysmal atrial fibrillation maintained sinus rhythm she is on flecainide which I will continue.  Her ejection fraction is minimally reduced.  But I think the benefits of rhythm control in view of the fact that she does not want to take an come to coagulation outweighed the risk of it. Aortic stenosis: Moderate based on last echocardiogram continue monitoring. Essential hypertension: Blood pressure high today but she tells me this is fairly typical for doctors office visit she was rushing today. Smoking much less than before but I told her ultimate goal is to completely discontinue smoking she  understand   Medication Adjustments/Labs and Tests Ordered: Current medicines are reviewed at length with the patient today.  Concerns regarding medicines are outlined above.  Orders Placed This Encounter  Procedures   AMB Referral to [facility]   Medication changes: No orders of the defined types were placed in this encounter.   Signed, Georgeanna Lea, MD, [facility] 04/14/2021 2:22 PM    [facility]

## 2021-04-15 NOTE — Addendum Note (Signed)
Addended by: Heywood Bene on: 04/15/2021 11:24 AM   Modules accepted: Orders

## 2021-04-19 ENCOUNTER — Other Ambulatory Visit: Payer: Self-pay | Admitting: Cardiology

## 2021-04-21 NOTE — Telephone Encounter (Signed)
Potassium chloride 10 meq # 90 x 2 refill sent to [facility] [place] [place] - [place], [place] - [address]

## 2021-05-14 ENCOUNTER — Encounter: Payer: Self-pay | Admitting: *Deleted

## 2021-07-20 ENCOUNTER — Other Ambulatory Visit: Payer: Self-pay | Admitting: Cardiology

## 2021-08-22 ENCOUNTER — Other Ambulatory Visit: Payer: Self-pay

## 2021-08-22 MED ORDER — AMLODIPINE BESY-BENAZEPRIL HCL 5-20 MG PO CAPS: 1.0000 | ORAL_CAPSULE | Freq: Every day | ORAL | 2 refills | Status: DC

## 2021-08-22 NOTE — Telephone Encounter (Signed)
Amlodipine-Benazp sent

## 2021-08-23 ENCOUNTER — Other Ambulatory Visit: Payer: Self-pay

## 2021-08-23 ENCOUNTER — Inpatient Hospital Stay (HOSPITAL_COMMUNITY)
Admission: EM | Admit: 2021-08-23 | Discharge: 2021-08-28 | DRG: 291 | Disposition: A | Discharge status: Home / Self Care | Payer: Medicare Other | Attending: Internal Medicine | Admitting: Internal Medicine

## 2021-08-23 ENCOUNTER — Emergency Department (HOSPITAL_COMMUNITY): Payer: Medicare Other

## 2021-08-23 ENCOUNTER — Encounter (HOSPITAL_COMMUNITY): Payer: Self-pay | Admitting: Emergency Medicine

## 2021-08-23 DIAGNOSIS — R350 Frequency of micturition: Secondary | ICD-10-CM | POA: Diagnosis present

## 2021-08-23 DIAGNOSIS — R0902 Hypoxemia: Secondary | ICD-10-CM | POA: Diagnosis not present

## 2021-08-23 DIAGNOSIS — J9621 Acute and chronic respiratory failure with hypoxia: Secondary | ICD-10-CM | POA: Diagnosis present

## 2021-08-23 DIAGNOSIS — Z7982 Long term (current) use of aspirin: Secondary | ICD-10-CM

## 2021-08-23 DIAGNOSIS — E1151 Type 2 diabetes mellitus with diabetic peripheral angiopathy without gangrene: Secondary | ICD-10-CM | POA: Diagnosis present

## 2021-08-23 DIAGNOSIS — I110 Hypertensive heart disease with heart failure: Principal | ICD-10-CM | POA: Diagnosis present

## 2021-08-23 DIAGNOSIS — Z20822 Contact with and (suspected) exposure to covid-19: Secondary | ICD-10-CM | POA: Diagnosis not present

## 2021-08-23 DIAGNOSIS — I160 Hypertensive urgency: Secondary | ICD-10-CM | POA: Diagnosis not present

## 2021-08-23 DIAGNOSIS — Y92009 Unspecified place in unspecified non-institutional (private) residence as the place of occurrence of the external cause: Secondary | ICD-10-CM | POA: Diagnosis not present

## 2021-08-23 DIAGNOSIS — E8729 Other acidosis: Secondary | ICD-10-CM | POA: Diagnosis present

## 2021-08-23 DIAGNOSIS — Z6836 Body mass index (BMI) 36.0-36.9, adult: Secondary | ICD-10-CM | POA: Diagnosis not present

## 2021-08-23 DIAGNOSIS — F17200 Nicotine dependence, unspecified, uncomplicated: Secondary | ICD-10-CM | POA: Diagnosis present

## 2021-08-23 DIAGNOSIS — Z8249 Family history of ischemic heart disease and other diseases of the circulatory system: Secondary | ICD-10-CM

## 2021-08-23 DIAGNOSIS — I5033 Acute on chronic diastolic (congestive) heart failure: Secondary | ICD-10-CM | POA: Diagnosis not present

## 2021-08-23 DIAGNOSIS — J449 Chronic obstructive pulmonary disease, unspecified: Secondary | ICD-10-CM | POA: Diagnosis present

## 2021-08-23 DIAGNOSIS — R0602 Shortness of breath: Secondary | ICD-10-CM

## 2021-08-23 DIAGNOSIS — Z79899 Other long term (current) drug therapy: Secondary | ICD-10-CM | POA: Diagnosis not present

## 2021-08-23 DIAGNOSIS — R062 Wheezing: Secondary | ICD-10-CM

## 2021-08-23 DIAGNOSIS — R059 Cough, unspecified: Secondary | ICD-10-CM | POA: Diagnosis not present

## 2021-08-23 DIAGNOSIS — E669 Obesity, unspecified: Secondary | ICD-10-CM | POA: Diagnosis not present

## 2021-08-23 DIAGNOSIS — Z833 Family history of diabetes mellitus: Secondary | ICD-10-CM | POA: Diagnosis not present

## 2021-08-23 DIAGNOSIS — R32 Unspecified urinary incontinence: Secondary | ICD-10-CM | POA: Diagnosis present

## 2021-08-23 DIAGNOSIS — J441 Chronic obstructive pulmonary disease with (acute) exacerbation: Secondary | ICD-10-CM | POA: Diagnosis present

## 2021-08-23 DIAGNOSIS — I517 Cardiomegaly: Secondary | ICD-10-CM | POA: Diagnosis not present

## 2021-08-23 DIAGNOSIS — I10 Essential (primary) hypertension: Secondary | ICD-10-CM | POA: Diagnosis present

## 2021-08-23 DIAGNOSIS — T501X6A Underdosing of loop [high-ceiling] diuretics, initial encounter: Secondary | ICD-10-CM | POA: Diagnosis present

## 2021-08-23 DIAGNOSIS — E785 Hyperlipidemia, unspecified: Secondary | ICD-10-CM | POA: Diagnosis present

## 2021-08-23 DIAGNOSIS — R0989 Other specified symptoms and signs involving the circulatory and respiratory systems: Secondary | ICD-10-CM | POA: Diagnosis present

## 2021-08-23 DIAGNOSIS — R682 Dry mouth, unspecified: Secondary | ICD-10-CM | POA: Diagnosis not present

## 2021-08-23 DIAGNOSIS — Z9981 Dependence on supplemental oxygen: Secondary | ICD-10-CM | POA: Diagnosis not present

## 2021-08-23 DIAGNOSIS — E119 Type 2 diabetes mellitus without complications: Secondary | ICD-10-CM | POA: Diagnosis not present

## 2021-08-23 DIAGNOSIS — I509 Heart failure, unspecified: Secondary | ICD-10-CM

## 2021-08-23 DIAGNOSIS — Z888 Allergy status to other drugs, medicaments and biological substances status: Secondary | ICD-10-CM

## 2021-08-23 DIAGNOSIS — I080 Rheumatic disorders of both mitral and aortic valves: Secondary | ICD-10-CM | POA: Diagnosis present

## 2021-08-23 DIAGNOSIS — Z91128 Patient's intentional underdosing of medication regimen for other reason: Secondary | ICD-10-CM | POA: Diagnosis not present

## 2021-08-23 DIAGNOSIS — I480 Paroxysmal atrial fibrillation: Secondary | ICD-10-CM | POA: Diagnosis not present

## 2021-08-23 DIAGNOSIS — J811 Chronic pulmonary edema: Secondary | ICD-10-CM | POA: Diagnosis not present

## 2021-08-23 DIAGNOSIS — R0609 Other forms of dyspnea: Secondary | ICD-10-CM | POA: Diagnosis not present

## 2021-08-23 DIAGNOSIS — I739 Peripheral vascular disease, unspecified: Secondary | ICD-10-CM | POA: Diagnosis not present

## 2021-08-23 LAB — BASIC METABOLIC PANEL
Anion gap: 6 (ref 5–15)
BUN: 12 mg/dL (ref 8–23)
CO2: 32 mmol/L (ref 22–32)
Calcium: 9.1 mg/dL (ref 8.9–10.3)
Chloride: 102 mmol/L (ref 98–111)
Creatinine, Ser: 0.87 mg/dL (ref 0.44–1.00)
GFR, Estimated: >60 mL/min (ref >60)
Glucose, Bld: 113 mg/dL — ABNORMAL HIGH (ref 70–99)
Potassium: 4.5 mmol/L (ref 3.5–5.1)
Sodium: 140 mmol/L (ref 135–145)

## 2021-08-23 LAB — RESP PANEL BY RT-PCR (FLU A&B, COVID) ARPGX2
Influenza A by PCR: NEGATIVE
Influenza B by PCR: NEGATIVE
SARS Coronavirus 2 by RT PCR: NEGATIVE

## 2021-08-23 LAB — CBC WITH DIFFERENTIAL/PLATELET
Abs Immature Granulocytes: 0.06 10*3/uL (ref 0.00–0.07)
Basophils Absolute: 0 10*3/uL (ref 0.0–0.1)
Basophils Relative: 1 %
Eosinophils Absolute: 0 10*3/uL (ref 0.0–0.5)
Eosinophils Relative: 0 %
HCT: 53.4 % — ABNORMAL HIGH (ref 36.0–46.0)
Hemoglobin: 17.1 g/dL — ABNORMAL HIGH (ref 12.0–15.0)
Immature Granulocytes: 1 %
Lymphocytes Relative: 22 %
Lymphs Abs: 2 10*3/uL (ref 0.7–4.0)
MCH: 29.7 pg (ref 26.0–34.0)
MCHC: 32 g/dL (ref 30.0–36.0)
MCV: 92.7 fL (ref 80.0–100.0)
Monocytes Absolute: 0.9 10*3/uL (ref 0.1–1.0)
Monocytes Relative: 10 %
Neutro Abs: 5.9 10*3/uL (ref 1.7–7.7)
Neutrophils Relative %: 66 %
Platelets: 176 10*3/uL (ref 150–400)
RBC: 5.76 MIL/uL — ABNORMAL HIGH (ref 3.87–5.11)
RDW: 14.2 % (ref 11.5–15.5)
WBC: 8.9 10*3/uL (ref 4.0–10.5)
nRBC: 0 % (ref 0.0–0.2)

## 2021-08-23 LAB — BRAIN NATRIURETIC PEPTIDE: B Natriuretic Peptide: 1554.6 pg/mL — ABNORMAL HIGH (ref 0.0–100.0)

## 2021-08-23 LAB — TROPONIN I (HIGH SENSITIVITY)
Troponin I (High Sensitivity): 17 ng/L (ref <18)
Troponin I (High Sensitivity): 18 ng/L — ABNORMAL HIGH (ref <18)
Troponin I (High Sensitivity): 15 ng/L (ref <18)
Troponin I (High Sensitivity): 13 ng/L (ref <18)

## 2021-08-23 MED ORDER — FUROSEMIDE 10 MG/ML IJ SOLN
60.0000 mg | Freq: Once | INTRAMUSCULAR | Status: AC
  Administered 2021-08-23: 60 mg via INTRAVENOUS
  Filled 2021-08-23: qty 6

## 2021-08-23 MED ORDER — IPRATROPIUM-ALBUTEROL 0.5-2.5 (3) MG/3ML IN SOLN
3.0000 mL | Freq: Once | RESPIRATORY_TRACT | Status: AC
  Administered 2021-08-23: 3 mL via RESPIRATORY_TRACT
  Filled 2021-08-23: qty 3

## 2021-08-23 MED ORDER — AMLODIPINE BESYLATE 5 MG PO TABS
5.0000 mg | ORAL_TABLET | Freq: Every day | ORAL | Status: DC
  Administered 2021-08-24: 08:00:00 5 mg via ORAL
  Filled 2021-08-23: qty 1

## 2021-08-23 MED ORDER — HYDRALAZINE HCL 20 MG/ML IJ SOLN
5.0000 mg | Freq: Once | INTRAMUSCULAR | Status: AC
  Administered 2021-08-23: 5 mg via INTRAVENOUS
  Filled 2021-08-23: qty 1

## 2021-08-23 MED ORDER — ACETAMINOPHEN 650 MG RE SUPP: 650.0000 mg | Freq: Four times a day (QID) | RECTAL | Status: DC | PRN

## 2021-08-23 MED ORDER — NITROGLYCERIN IN D5W 200-5 MCG/ML-% IV SOLN
0.0000 ug/min | INTRAVENOUS | Status: DC
  Administered 2021-08-23: 10 ug/min via INTRAVENOUS
  Filled 2021-08-23: qty 250

## 2021-08-23 MED ORDER — ALBUTEROL SULFATE (2.5 MG/3ML) 0.083% IN NEBU: 2.5000 mg | INHALATION_SOLUTION | Freq: Four times a day (QID) | RESPIRATORY_TRACT | Status: DC | PRN

## 2021-08-23 MED ORDER — FUROSEMIDE 10 MG/ML IJ SOLN
80.0000 mg | Freq: Two times a day (BID) | INTRAMUSCULAR | Status: DC
  Administered 2021-08-24 – 2021-08-26 (×5): 80 mg via INTRAVENOUS
  Filled 2021-08-23 (×5): qty 8

## 2021-08-23 MED ORDER — ASPIRIN EC 81 MG PO TBEC
162.0000 mg | DELAYED_RELEASE_TABLET | Freq: Every day | ORAL | Status: DC
  Administered 2021-08-24 – 2021-08-28 (×5): 162 mg via ORAL
  Filled 2021-08-23 (×5): qty 2

## 2021-08-23 MED ORDER — BENAZEPRIL HCL 20 MG PO TABS
20.0000 mg | ORAL_TABLET | Freq: Every day | ORAL | Status: DC
  Administered 2021-08-24: 12:00:00 20 mg via ORAL
  Filled 2021-08-23: qty 1

## 2021-08-23 MED ORDER — METOPROLOL SUCCINATE ER 50 MG PO TB24
50.0000 mg | ORAL_TABLET | Freq: Two times a day (BID) | ORAL | Status: DC
  Administered 2021-08-23 – 2021-08-25 (×4): 50 mg via ORAL
  Filled 2021-08-23 (×3): qty 1
  Filled 2021-08-23: qty 2

## 2021-08-23 MED ORDER — ACETAMINOPHEN 325 MG PO TABS: 650.0000 mg | ORAL_TABLET | Freq: Four times a day (QID) | ORAL | Status: DC | PRN

## 2021-08-23 MED ORDER — INSULIN ASPART 100 UNIT/ML IJ SOLN
0.0000 [IU] | Freq: Three times a day (TID) | INTRAMUSCULAR | Status: DC
  Administered 2021-08-24 (×2): 2 [IU] via SUBCUTANEOUS
  Administered 2021-08-24: 18:00:00 5 [IU] via SUBCUTANEOUS
  Administered 2021-08-25 – 2021-08-27 (×6): 3 [IU] via SUBCUTANEOUS
  Administered 2021-08-27: 5 [IU] via SUBCUTANEOUS

## 2021-08-23 MED ORDER — FLECAINIDE ACETATE 50 MG PO TABS
50.0000 mg | ORAL_TABLET | Freq: Two times a day (BID) | ORAL | Status: DC
  Administered 2021-08-23 – 2021-08-28 (×10): 50 mg via ORAL
  Filled 2021-08-23 (×11): qty 1

## 2021-08-23 MED ORDER — ENOXAPARIN SODIUM 40 MG/0.4ML IJ SOSY
40.0000 mg | PREFILLED_SYRINGE | Freq: Every day | INTRAMUSCULAR | Status: DC
  Administered 2021-08-24 – 2021-08-28 (×5): 40 mg via SUBCUTANEOUS
  Filled 2021-08-23 (×5): qty 0.4

## 2021-08-23 MED ORDER — SODIUM CHLORIDE 0.9% FLUSH
3.0000 mL | Freq: Two times a day (BID) | INTRAVENOUS | Status: DC
  Administered 2021-08-24 – 2021-08-28 (×8): 3 mL via INTRAVENOUS

## 2021-08-23 NOTE — ED Notes (Signed)
Patient transported to X-ray 
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
## 2021-08-23 NOTE — ED Provider Notes (Signed)
Emergency Medicine Provider Triage Evaluation Note  LARISA LANIUS , a 70 y.o. female  was evaluated in triage.  Pt complains of hypoxia. She wears O2 at home. Noticed that her O2 was as low as 72%. She is up to 5 L and normally uses 3-4 L of O2. She is feeling short of breath. No chest pain. New cough for about 5 days. It is productive with brown sputum. Hx of CHF, HTN , COPD, DM, smoker. No fevers  Review of Systems  Positive: See above Negative:   Physical Exam  LMP  (LMP Unknown)  Gen:   Awake, no distress   Resp:  Dyspneic. Lungs CTA MSK:   Moves extremities without difficulty  Other:  No leg swelling  Medical Decision Making  Medically screening exam initiated at 4:32 PM.  Appropriate orders placed.  TORRYN FISKE was informed that the remainder of the evaluation will be completed by another provider, this initial triage assessment does not replace that evaluation, and the importance of remaining in the ED until their evaluation is complete.       Therese Sarah 08/23/21 1642    Gerhard Munch, MD 08/23/21 1743
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
## 2021-08-23 NOTE — ED Notes (Signed)
Son Dorinda Hill [phone] would like an update
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
## 2021-08-24 ENCOUNTER — Observation Stay (HOSPITAL_COMMUNITY): Payer: Medicare Other

## 2021-08-24 DIAGNOSIS — Z9981 Dependence on supplemental oxygen: Secondary | ICD-10-CM | POA: Diagnosis not present

## 2021-08-24 DIAGNOSIS — I110 Hypertensive heart disease with heart failure: Secondary | ICD-10-CM | POA: Diagnosis present

## 2021-08-24 DIAGNOSIS — Z833 Family history of diabetes mellitus: Secondary | ICD-10-CM | POA: Diagnosis not present

## 2021-08-24 DIAGNOSIS — R682 Dry mouth, unspecified: Secondary | ICD-10-CM | POA: Diagnosis present

## 2021-08-24 DIAGNOSIS — Z79899 Other long term (current) drug therapy: Secondary | ICD-10-CM | POA: Diagnosis not present

## 2021-08-24 DIAGNOSIS — I080 Rheumatic disorders of both mitral and aortic valves: Secondary | ICD-10-CM | POA: Diagnosis present

## 2021-08-24 DIAGNOSIS — I517 Cardiomegaly: Secondary | ICD-10-CM | POA: Diagnosis not present

## 2021-08-24 DIAGNOSIS — R0609 Other forms of dyspnea: Secondary | ICD-10-CM

## 2021-08-24 DIAGNOSIS — E669 Obesity, unspecified: Secondary | ICD-10-CM | POA: Diagnosis present

## 2021-08-24 DIAGNOSIS — R0602 Shortness of breath: Secondary | ICD-10-CM | POA: Diagnosis not present

## 2021-08-24 DIAGNOSIS — Z8249 Family history of ischemic heart disease and other diseases of the circulatory system: Secondary | ICD-10-CM | POA: Diagnosis not present

## 2021-08-24 DIAGNOSIS — J9621 Acute and chronic respiratory failure with hypoxia: Secondary | ICD-10-CM | POA: Diagnosis present

## 2021-08-24 DIAGNOSIS — R350 Frequency of micturition: Secondary | ICD-10-CM | POA: Diagnosis present

## 2021-08-24 DIAGNOSIS — E785 Hyperlipidemia, unspecified: Secondary | ICD-10-CM | POA: Diagnosis present

## 2021-08-24 DIAGNOSIS — T501X6A Underdosing of loop [high-ceiling] diuretics, initial encounter: Secondary | ICD-10-CM | POA: Diagnosis present

## 2021-08-24 DIAGNOSIS — I5033 Acute on chronic diastolic (congestive) heart failure: Secondary | ICD-10-CM | POA: Diagnosis present

## 2021-08-24 DIAGNOSIS — Z91128 Patient's intentional underdosing of medication regimen for other reason: Secondary | ICD-10-CM | POA: Diagnosis not present

## 2021-08-24 DIAGNOSIS — I480 Paroxysmal atrial fibrillation: Secondary | ICD-10-CM | POA: Diagnosis present

## 2021-08-24 DIAGNOSIS — Z888 Allergy status to other drugs, medicaments and biological substances status: Secondary | ICD-10-CM | POA: Diagnosis not present

## 2021-08-24 DIAGNOSIS — F17200 Nicotine dependence, unspecified, uncomplicated: Secondary | ICD-10-CM | POA: Diagnosis present

## 2021-08-24 DIAGNOSIS — I160 Hypertensive urgency: Secondary | ICD-10-CM | POA: Diagnosis present

## 2021-08-24 DIAGNOSIS — R0902 Hypoxemia: Secondary | ICD-10-CM | POA: Diagnosis present

## 2021-08-24 DIAGNOSIS — E8729 Other acidosis: Secondary | ICD-10-CM | POA: Diagnosis present

## 2021-08-24 DIAGNOSIS — J449 Chronic obstructive pulmonary disease, unspecified: Secondary | ICD-10-CM | POA: Diagnosis present

## 2021-08-24 DIAGNOSIS — Z6836 Body mass index (BMI) 36.0-36.9, adult: Secondary | ICD-10-CM | POA: Diagnosis not present

## 2021-08-24 DIAGNOSIS — E1151 Type 2 diabetes mellitus with diabetic peripheral angiopathy without gangrene: Secondary | ICD-10-CM | POA: Diagnosis present

## 2021-08-24 DIAGNOSIS — Z20822 Contact with and (suspected) exposure to covid-19: Secondary | ICD-10-CM | POA: Diagnosis present

## 2021-08-24 DIAGNOSIS — Y92009 Unspecified place in unspecified non-institutional (private) residence as the place of occurrence of the external cause: Secondary | ICD-10-CM | POA: Diagnosis not present

## 2021-08-24 DIAGNOSIS — Z7982 Long term (current) use of aspirin: Secondary | ICD-10-CM | POA: Diagnosis not present

## 2021-08-24 LAB — ECHOCARDIOGRAM COMPLETE
AR max vel: 1.3 cm2
AV Area VTI: 1.34 cm2
AV Area mean vel: 1.2 cm2
AV Mean grad: 20 mmHg
AV Peak grad: 35.5 mmHg
Ao pk vel: 2.98 m/s
Area-P 1/2: 1.6 cm2
Height: 62 in
P 1/2 time: 626 msec
S' Lateral: 2.9 cm
Weight: 3219.21 oz

## 2021-08-24 LAB — BLOOD GAS, ARTERIAL
Acid-Base Excess: 8.5 mmol/L — ABNORMAL HIGH (ref 0.0–2.0)
Acid-Base Excess: 10.6 mmol/L — ABNORMAL HIGH (ref 0.0–2.0)
Bicarbonate: 34.7 mmol/L — ABNORMAL HIGH (ref 20.0–28.0)
Bicarbonate: 36.1 mmol/L — ABNORMAL HIGH (ref 20.0–28.0)
Drawn by: 24487
Drawn by: 164
FIO2: 52
FIO2: 40
O2 Saturation: 94.9 %
O2 Saturation: 93.6 %
Patient temperature: 36.4
Patient temperature: 36.5
pCO2 arterial: 68.3 mmHg (ref 32.0–48.0)
pCO2 arterial: 61.9 mmHg — ABNORMAL HIGH (ref 32.0–48.0)
pH, Arterial: 7.322 — ABNORMAL LOW (ref 7.350–7.450)
pH, Arterial: 7.381 (ref 7.350–7.450)
pO2, Arterial: 82.9 mmHg — ABNORMAL LOW (ref 83.0–108.0)
pO2, Arterial: 71.2 mmHg — ABNORMAL LOW (ref 83.0–108.0)

## 2021-08-24 LAB — CBC
HCT: 53.7 % — ABNORMAL HIGH (ref 36.0–46.0)
Hemoglobin: 17.3 g/dL — ABNORMAL HIGH (ref 12.0–15.0)
MCH: 29.5 pg (ref 26.0–34.0)
MCHC: 32.2 g/dL (ref 30.0–36.0)
MCV: 91.5 fL (ref 80.0–100.0)
Platelets: 199 10*3/uL (ref 150–400)
RBC: 5.87 MIL/uL — ABNORMAL HIGH (ref 3.87–5.11)
RDW: 14.3 % (ref 11.5–15.5)
WBC: 11 10*3/uL — ABNORMAL HIGH (ref 4.0–10.5)
nRBC: 0 % (ref 0.0–0.2)

## 2021-08-24 LAB — GLUCOSE, CAPILLARY
Glucose-Capillary: 134 mg/dL — ABNORMAL HIGH (ref 70–99)
Glucose-Capillary: 134 mg/dL — ABNORMAL HIGH (ref 70–99)
Glucose-Capillary: 219 mg/dL — ABNORMAL HIGH (ref 70–99)
Glucose-Capillary: 98 mg/dL (ref 70–99)

## 2021-08-24 LAB — BASIC METABOLIC PANEL
Anion gap: 8 (ref 5–15)
BUN: 13 mg/dL (ref 8–23)
CO2: 34 mmol/L — ABNORMAL HIGH (ref 22–32)
Calcium: 8.7 mg/dL — ABNORMAL LOW (ref 8.9–10.3)
Chloride: 99 mmol/L (ref 98–111)
Creatinine, Ser: 0.93 mg/dL (ref 0.44–1.00)
GFR, Estimated: >60 mL/min (ref >60)
Glucose, Bld: 141 mg/dL — ABNORMAL HIGH (ref 70–99)
Potassium: 4 mmol/L (ref 3.5–5.1)
Sodium: 141 mmol/L (ref 135–145)

## 2021-08-24 LAB — HIV ANTIBODY (ROUTINE TESTING W REFLEX): HIV Screen 4th Generation wRfx: NONREACTIVE

## 2021-08-24 LAB — MAGNESIUM: Magnesium: 1.6 mg/dL — ABNORMAL LOW (ref 1.7–2.4)

## 2021-08-24 MED ORDER — MAGNESIUM SULFATE 2 GM/50ML IV SOLN
2.0000 g | Freq: Once | INTRAVENOUS | Status: AC
  Administered 2021-08-24: 09:00:00 2 g via INTRAVENOUS
  Filled 2021-08-24: qty 50

## 2021-08-24 MED ORDER — BENAZEPRIL HCL 40 MG PO TABS
40.0000 mg | ORAL_TABLET | Freq: Every day | ORAL | Status: DC
  Administered 2021-08-25 – 2021-08-28 (×4): 40 mg via ORAL
  Filled 2021-08-24 (×4): qty 1

## 2021-08-24 MED ORDER — IPRATROPIUM-ALBUTEROL 0.5-2.5 (3) MG/3ML IN SOLN: 3.0000 mL | RESPIRATORY_TRACT | Status: DC | PRN

## 2021-08-24 MED ORDER — AMLODIPINE BESYLATE 10 MG PO TABS
10.0000 mg | ORAL_TABLET | Freq: Every day | ORAL | Status: DC
  Administered 2021-08-25 – 2021-08-28 (×4): 10 mg via ORAL
  Filled 2021-08-24 (×4): qty 1

## 2021-08-24 MED ORDER — HYDRALAZINE HCL 20 MG/ML IJ SOLN: 5.0000 mg | Freq: Three times a day (TID) | INTRAMUSCULAR | Status: DC | PRN
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
## 2021-08-24 NOTE — Progress Notes (Signed)
  Echocardiogram 2D Echocardiogram has been performed.  Makayla Leach 08/24/2021, 10:46 AM

## 2021-08-24 NOTE — Progress Notes (Signed)
Patient's O2 sats started to drop into the low 80s on 5 L [place]. This RN went into the pt's room to assess her. Upon assessment the pt was lethargic and confused. She was unable to answer any orientation questions and was speaking inappropriately. Her O2 needs were increased from 5L to 9L [place]. The pt's husband stated that the pt woke up in a ' coughing fit' when her sats dropped. Dr. Rachael Darby was contacted to suggest an ABG. STAT ABG and CXR ordered. Pt now alert to herself and place. Awaiting results.

## 2021-08-25 ENCOUNTER — Inpatient Hospital Stay (HOSPITAL_COMMUNITY): Payer: Medicare Other

## 2021-08-25 DIAGNOSIS — J9621 Acute and chronic respiratory failure with hypoxia: Secondary | ICD-10-CM | POA: Diagnosis not present

## 2021-08-25 LAB — URINALYSIS, ROUTINE W REFLEX MICROSCOPIC
Bilirubin Urine: NEGATIVE
Glucose, UA: NEGATIVE mg/dL
Hgb urine dipstick: NEGATIVE
Ketones, ur: NEGATIVE mg/dL
Nitrite: NEGATIVE
Protein, ur: NEGATIVE mg/dL
Specific Gravity, Urine: 1.01 (ref 1.005–1.030)
pH: 5.5 (ref 5.0–8.0)

## 2021-08-25 LAB — RESPIRATORY PANEL BY PCR

## 2021-08-25 LAB — CBC
HCT: 53.1 % — ABNORMAL HIGH (ref 36.0–46.0)
Hemoglobin: 17 g/dL — ABNORMAL HIGH (ref 12.0–15.0)
MCH: 29.6 pg (ref 26.0–34.0)
MCHC: 32 g/dL (ref 30.0–36.0)
MCV: 92.5 fL (ref 80.0–100.0)
Platelets: 211 10*3/uL (ref 150–400)
RBC: 5.74 MIL/uL — ABNORMAL HIGH (ref 3.87–5.11)
RDW: 14.4 % (ref 11.5–15.5)
WBC: 12.2 10*3/uL — ABNORMAL HIGH (ref 4.0–10.5)
nRBC: 0 % (ref 0.0–0.2)

## 2021-08-25 LAB — BASIC METABOLIC PANEL
Anion gap: 9 (ref 5–15)
BUN: 14 mg/dL (ref 8–23)
CO2: 38 mmol/L — ABNORMAL HIGH (ref 22–32)
Calcium: 8.5 mg/dL — ABNORMAL LOW (ref 8.9–10.3)
Chloride: 93 mmol/L — ABNORMAL LOW (ref 98–111)
Creatinine, Ser: 1 mg/dL (ref 0.44–1.00)
GFR, Estimated: >60 mL/min (ref >60)
Glucose, Bld: 90 mg/dL (ref 70–99)
Potassium: 3.8 mmol/L (ref 3.5–5.1)
Sodium: 140 mmol/L (ref 135–145)

## 2021-08-25 LAB — URINALYSIS, MICROSCOPIC (REFLEX)

## 2021-08-25 LAB — GLUCOSE, CAPILLARY
Glucose-Capillary: 113 mg/dL — ABNORMAL HIGH (ref 70–99)
Glucose-Capillary: 161 mg/dL — ABNORMAL HIGH (ref 70–99)
Glucose-Capillary: 188 mg/dL — ABNORMAL HIGH (ref 70–99)
Glucose-Capillary: 183 mg/dL — ABNORMAL HIGH (ref 70–99)

## 2021-08-25 LAB — MAGNESIUM: Magnesium: 1.5 mg/dL — ABNORMAL LOW (ref 1.7–2.4)

## 2021-08-25 MED ORDER — METOPROLOL SUCCINATE ER 25 MG PO TB24
25.0000 mg | ORAL_TABLET | Freq: Two times a day (BID) | ORAL | Status: DC
  Administered 2021-08-25 – 2021-08-28 (×6): 25 mg via ORAL
  Filled 2021-08-25 (×6): qty 1

## 2021-08-25 MED ORDER — PANTOPRAZOLE SODIUM 40 MG PO TBEC
40.0000 mg | DELAYED_RELEASE_TABLET | Freq: Every day | ORAL | Status: DC
  Administered 2021-08-25 – 2021-08-28 (×4): 40 mg via ORAL
  Filled 2021-08-25 (×4): qty 1

## 2021-08-25 MED ORDER — MAGNESIUM SULFATE 2 GM/50ML IV SOLN
2.0000 g | Freq: Once | INTRAVENOUS | Status: AC
  Administered 2021-08-25: 2 g via INTRAVENOUS
  Filled 2021-08-25: qty 50

## 2021-08-25 MED ORDER — IPRATROPIUM-ALBUTEROL 0.5-2.5 (3) MG/3ML IN SOLN
3.0000 mL | Freq: Four times a day (QID) | RESPIRATORY_TRACT | Status: DC
Start: 2021-08-25 — End: 2021-08-28
  Administered 2021-08-25 – 2021-08-27 (×9): 3 mL via RESPIRATORY_TRACT
  Filled 2021-08-25 (×10): qty 3

## 2021-08-25 MED ORDER — PREDNISONE 20 MG PO TABS
40.0000 mg | ORAL_TABLET | Freq: Every day | ORAL | Status: DC
  Administered 2021-08-25 – 2021-08-28 (×4): 40 mg via ORAL
  Filled 2021-08-25 (×4): qty 2

## 2021-08-25 NOTE — Progress Notes (Signed)
   Screened for HF [facility].   Admitted with A/C HFpEF. She stopped taking lasix due to incontinence.   Discussed clinic however they would like continue to be followed by Dr Bing Matter. Declined referral to HF [facility] clinic.   Discussed importance of medication compliance.   Cyrus Ramsburg NP-C  12:29 PM
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
## 2021-08-25 NOTE — Progress Notes (Signed)
Mobility Specialist Progress Note:   08/25/21 1350  Mobility  Activity Ambulated in hall  Level of Assistance Contact guard assist, steadying assist  Assistive Device Front wheel walker  Distance Ambulated (ft) 260 ft  Mobility Ambulated with assistance in hallway  Mobility Response Tolerated well  Mobility performed by Mobility specialist  $Mobility charge 1 Mobility   Pt received in bed willing to participate in mobility. No complaints of pain. Pt returned to EOB with call bell in reach, all needs met and husband present.   [facility] Public librarian Phone [phone] Secondary Phone [phone]

## 2021-08-25 NOTE — Progress Notes (Signed)
Patient declined BIPAP use at this time. Equipment on stand-by at bedside and patient aware to call for RT if needed.

## 2021-08-25 NOTE — Progress Notes (Signed)
RT at bedside to place patient on BIPAP. Patient attempted to wear but unable to tolerate. Placed back on 6L HFNC. No distress noted at this time. Rt will monitor as needed.

## 2021-08-25 NOTE — Plan of Care (Signed)
  Problem: Education: Goal: Ability to demonstrate management of disease process will improve Outcome: Progressing Goal: Ability to verbalize understanding of medication therapies will improve Outcome: Progressing   

## 2021-08-26 DIAGNOSIS — J9621 Acute and chronic respiratory failure with hypoxia: Secondary | ICD-10-CM | POA: Diagnosis not present

## 2021-08-26 LAB — GLUCOSE, CAPILLARY
Glucose-Capillary: 168 mg/dL — ABNORMAL HIGH (ref 70–99)
Glucose-Capillary: 199 mg/dL — ABNORMAL HIGH (ref 70–99)
Glucose-Capillary: 162 mg/dL — ABNORMAL HIGH (ref 70–99)
Glucose-Capillary: 230 mg/dL — ABNORMAL HIGH (ref 70–99)

## 2021-08-26 LAB — CBC
HCT: 52.2 % — ABNORMAL HIGH (ref 36.0–46.0)
Hemoglobin: 16.7 g/dL — ABNORMAL HIGH (ref 12.0–15.0)
MCH: 29.5 pg (ref 26.0–34.0)
MCHC: 32 g/dL (ref 30.0–36.0)
MCV: 92.1 fL (ref 80.0–100.0)
Platelets: 219 10*3/uL (ref 150–400)
RBC: 5.67 MIL/uL — ABNORMAL HIGH (ref 3.87–5.11)
RDW: 13.8 % (ref 11.5–15.5)
WBC: 11.8 10*3/uL — ABNORMAL HIGH (ref 4.0–10.5)
nRBC: 0 % (ref 0.0–0.2)

## 2021-08-26 LAB — BASIC METABOLIC PANEL
Anion gap: 10 (ref 5–15)
BUN: 17 mg/dL (ref 8–23)
CO2: 35 mmol/L — ABNORMAL HIGH (ref 22–32)
Calcium: 8.3 mg/dL — ABNORMAL LOW (ref 8.9–10.3)
Chloride: 90 mmol/L — ABNORMAL LOW (ref 98–111)
Creatinine, Ser: 0.89 mg/dL (ref 0.44–1.00)
GFR, Estimated: >60 mL/min (ref >60)
Glucose, Bld: 143 mg/dL — ABNORMAL HIGH (ref 70–99)
Potassium: 3.6 mmol/L (ref 3.5–5.1)
Sodium: 135 mmol/L (ref 135–145)

## 2021-08-26 LAB — MAGNESIUM: Magnesium: 1.9 mg/dL (ref 1.7–2.4)

## 2021-08-26 MED ORDER — FUROSEMIDE 10 MG/ML IJ SOLN
80.0000 mg | Freq: Three times a day (TID) | INTRAMUSCULAR | Status: DC
  Administered 2021-08-26 – 2021-08-28 (×6): 80 mg via INTRAVENOUS
  Filled 2021-08-26 (×6): qty 8

## 2021-08-26 NOTE — Progress Notes (Signed)
Patient refused BIPAP at this time. On stand by if needed.

## 2021-08-26 NOTE — Progress Notes (Signed)
Husband requesting to let patient sleep for now and her to be weighed after shift change. Will endorse to oncoming shift if weight not completed before 7.
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
## 2021-08-26 NOTE — Progress Notes (Signed)
Mobility Specialist Progress Note:   08/26/21 1416  Mobility  Activity Ambulated in hall  Level of Assistance Standby assist, set-up cues, supervision of patient - no hands on  Assistive Device Front wheel walker  Distance Ambulated (ft) 520 ft  Mobility Ambulated with assistance in hallway  Mobility Response Tolerated well  Mobility performed by Mobility specialist  $Mobility charge 1 Mobility   Pt received in bed willing to participate in mobility. No complaints of pain. Pt returned to bed with call bell in reach and all needs met.   [facility] Public librarian Phone [phone] Secondary Phone [phone]

## 2021-08-27 DIAGNOSIS — J9621 Acute and chronic respiratory failure with hypoxia: Secondary | ICD-10-CM | POA: Diagnosis not present

## 2021-08-27 LAB — CBC
HCT: 51.9 % — ABNORMAL HIGH (ref 36.0–46.0)
Hemoglobin: 16.7 g/dL — ABNORMAL HIGH (ref 12.0–15.0)
MCH: 29.8 pg (ref 26.0–34.0)
MCHC: 32.2 g/dL (ref 30.0–36.0)
MCV: 92.7 fL (ref 80.0–100.0)
Platelets: 232 10*3/uL (ref 150–400)
RBC: 5.6 MIL/uL — ABNORMAL HIGH (ref 3.87–5.11)
RDW: 13.8 % (ref 11.5–15.5)
WBC: 13.9 10*3/uL — ABNORMAL HIGH (ref 4.0–10.5)
nRBC: 0 % (ref 0.0–0.2)

## 2021-08-27 LAB — BASIC METABOLIC PANEL
Anion gap: 11 (ref 5–15)
BUN: 20 mg/dL (ref 8–23)
CO2: 38 mmol/L — ABNORMAL HIGH (ref 22–32)
Calcium: 8.9 mg/dL (ref 8.9–10.3)
Chloride: 88 mmol/L — ABNORMAL LOW (ref 98–111)
Creatinine, Ser: 0.95 mg/dL (ref 0.44–1.00)
GFR, Estimated: >60 mL/min (ref >60)
Glucose, Bld: 133 mg/dL — ABNORMAL HIGH (ref 70–99)
Potassium: 4 mmol/L (ref 3.5–5.1)
Sodium: 137 mmol/L (ref 135–145)

## 2021-08-27 LAB — GLUCOSE, CAPILLARY
Glucose-Capillary: 110 mg/dL — ABNORMAL HIGH (ref 70–99)
Glucose-Capillary: 159 mg/dL — ABNORMAL HIGH (ref 70–99)
Glucose-Capillary: 242 mg/dL — ABNORMAL HIGH (ref 70–99)
Glucose-Capillary: 276 mg/dL — ABNORMAL HIGH (ref 70–99)

## 2021-08-27 MED ORDER — SALINE SPRAY 0.65 % NA SOLN
1.0000 | NASAL | Status: DC | PRN
  Filled 2021-08-27: qty 44

## 2021-08-27 NOTE — Plan of Care (Signed)
  Problem: Education: Goal: Ability to demonstrate management of disease process will improve Outcome: Progressing Goal: Ability to verbalize understanding of medication therapies will improve Outcome: Progressing   

## 2021-08-27 NOTE — Plan of Care (Signed)
  Problem: Education: Goal: Ability to demonstrate management of disease process will improve 08/27/2021 0435 by Nicole Cella, RN Outcome: Progressing 08/27/2021 0139 by Nicole Cella, RN Outcome: Progressing Goal: Ability to verbalize understanding of medication therapies will improve 08/27/2021 0435 by Nicole Cella, RN Outcome: Progressing 08/27/2021 0139 by Nicole Cella, RN Outcome: Progressing

## 2021-08-27 NOTE — Consult Note (Signed)
   [facility] Inpatient Consult   08/27/2021  Makayla Leach May 28, 1951 [id number]  Late entry for 08/26/21 3 pm:  [name] Patient: Makayla Leach  Primary Care Provider:  Mateo Flow, MD, [facility], [facility] is an Embedded Provider   Patient screened for hospitalization discussion of limited resources assess for potential [place] Management service needs for post hospital transition.  Review of patient's medical record reveals patient is with ongoing medical management.  Late entry: 3 pm 08/26/21 Met with patient and husband at bedside.  Patient endorses her provider office.  She currently has not had a nurse to reach out regarding care management needs. Patient endorses willingness for post hospital follow up for chronic care needs. 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 Patient did show some periods of memory difficulties noted. Husband assist with names and situations discussed.  Plan:  Continue to follow progress and disposition to assess for post hospital care management needs.    For questions contact:   Natividad Brood, RN BSN [facility] Liaison  [phone] business mobile phone Toll free office [phone]  Fax number: [phone] [email or web address]
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
## 2021-08-27 NOTE — Progress Notes (Deleted)
Mobility Specialist Progress Note:   08/27/21 0957  Mobility  Activity Ambulated in room  Level of Assistance Modified independent, requires aide device or extra time  Assistive Device None  Distance Ambulated (ft) 40 ft  Mobility Ambulated with assistance in room  Mobility Response Tolerated well  Mobility performed by Mobility specialist  $Mobility charge 1 Mobility   No complaints of pain. Pt left EOB with call bell in reach, all needs met and family present.   [facility] Public librarian Phone [phone] Secondary Phone [phone]

## 2021-08-27 NOTE — Progress Notes (Signed)
Mobility Specialist Progress Note:   08/27/21 1623  Mobility  Activity Ambulated in hall  Level of Assistance Standby assist, set-up cues, supervision of patient - no hands on  Assistive Device Front wheel walker  Distance Ambulated (ft) 520 ft  Mobility Ambulated with assistance in hallway  Mobility performed by Mobility specialist  $Mobility charge 1 Mobility   Pt received in bed willing to participate in mobility. No complaints of pain. Pt left EOB with call bell in reach and all needs met.   [facility] Public librarian Phone [phone] Secondary Phone [phone]

## 2021-08-28 DIAGNOSIS — J9621 Acute and chronic respiratory failure with hypoxia: Secondary | ICD-10-CM | POA: Diagnosis not present

## 2021-08-28 LAB — BASIC METABOLIC PANEL
Anion gap: 9 (ref 5–15)
BUN: 23 mg/dL (ref 8–23)
CO2: 38 mmol/L — ABNORMAL HIGH (ref 22–32)
Calcium: 8.7 mg/dL — ABNORMAL LOW (ref 8.9–10.3)
Chloride: 88 mmol/L — ABNORMAL LOW (ref 98–111)
Creatinine, Ser: 1.04 mg/dL — ABNORMAL HIGH (ref 0.44–1.00)
GFR, Estimated: 59 mL/min — ABNORMAL LOW (ref >60)
Glucose, Bld: 151 mg/dL — ABNORMAL HIGH (ref 70–99)
Potassium: 4.2 mmol/L (ref 3.5–5.1)
Sodium: 135 mmol/L (ref 135–145)

## 2021-08-28 LAB — GLUCOSE, CAPILLARY
Glucose-Capillary: 114 mg/dL — ABNORMAL HIGH (ref 70–99)
Glucose-Capillary: 166 mg/dL — ABNORMAL HIGH (ref 70–99)

## 2021-08-28 LAB — MAGNESIUM: Magnesium: 1.8 mg/dL (ref 1.7–2.4)

## 2021-08-28 MED ORDER — ASPIRIN EC 81 MG PO TBEC: 162.0000 mg | DELAYED_RELEASE_TABLET | Freq: Every day | ORAL | 11 refills | Status: DC

## 2021-08-28 MED ORDER — IPRATROPIUM-ALBUTEROL 0.5-2.5 (3) MG/3ML IN SOLN
3.0000 mL | Freq: Three times a day (TID) | RESPIRATORY_TRACT | Status: DC
  Administered 2021-08-28: 3 mL via RESPIRATORY_TRACT
  Filled 2021-08-28 (×2): qty 3

## 2021-08-28 MED ORDER — FUROSEMIDE 40 MG PO TABS: 40.0000 mg | ORAL_TABLET | Freq: Every day | ORAL | 11 refills | Status: DC

## 2021-08-28 MED ORDER — INSULIN ASPART 100 UNIT/ML IJ SOLN
0.0000 [IU] | Freq: Three times a day (TID) | INTRAMUSCULAR | Status: DC
  Administered 2021-08-28: 3 [IU] via SUBCUTANEOUS

## 2021-08-28 NOTE — TOC Transition Note (Signed)
Transition of Care [facility]) - CM/SW Discharge Note   Patient Details  Name: Makayla Leach MRN: [id number] Date of Birth: [birth year]  [facility]) CM/SW Contact:  Leone Haven, RN Phone Number: 08/28/2021, 1:07 PM   Clinical Narrative:    Patient discharged home, per Staff RN Ute, patient is on 2 liters, which she already has oxygen at home. She has transportation home and they brought her oxygen to transport her home with it. She has not needs.         Patient Goals and CMS Choice        Discharge Placement                       Discharge Plan and Services                                     Social Determinants of Health (SDOH) Interventions     Readmission Risk Interventions No flowsheet data found.
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
## 2021-08-28 NOTE — Progress Notes (Signed)
Pt agitated and occasionally attempting to pull off high flow nasal cannula. O2 sat = 92%, but drops to 80s when pt pulls off HFNC. Husband requests staff not to wake pt or get weight until after shift change. Will endorse to day shift, but will re-attempt weight at shift change.

## 2021-08-28 NOTE — Plan of Care (Addendum)
Nutrition Education Note  RD consulted for nutrition education regarding heart failure.  RD provided "Low Sodium Nutrition Therapy" handout from the Academy of Nutrition and Dietetics. Reviewed patient's dietary recall. Provided examples on ways to decrease sodium intake in diet. Discouraged intake of processed foods and use of salt shaker. Encouraged fresh fruits and vegetables as well as whole grain sources of carbohydrates to maximize fiber intake.   Pt and husband agreeable to education.  RD discussed why it is important for patient to adhere to diet recommendations, and emphasized the role of fluids, foods to avoid, and importance of weighing self daily. Teach back method used.  Expect fair compliance.  Body mass index is 35.76 kg/m. Pt meets criteria for Obesity Class 2 based on current BMI.  Current diet order is Heart Healthy/Carb Modified, patient is consuming approximately 100% of meals at this time. Labs and medications reviewed.   No further nutrition interventions warranted at this time. RD contact information provided. If additional nutrition issues arise, please re-consult RD.   Vertell Limber, RD, LDN (she/her/hers) Clinical Inpatient Dietitian RD Pager/After-Hours/Weekend Pager # in [place]

## 2021-08-28 NOTE — Progress Notes (Signed)
Pt desats to the 80s with activity and with prolonged conversation while on 5L HFNC.

## 2021-08-28 NOTE — Progress Notes (Signed)
SATURATION QUALIFICATIONS: (This note is used to comply with regulatory documentation for home oxygen)  Patient Saturations on Room Air at Rest = 88%  Patient Saturations on Room Air while Ambulating = 83%  Patient Saturations on 2 Liters of oxygen while Ambulating = 96%  Please briefly explain why patient needs home oxygen: Pt's sats decrease at rest and when ambulating without supplemental O2.   Raymond Gurney, PT, DPT Acute Rehabilitation Services  Pager: [phone] Office: [phone]

## 2021-08-28 NOTE — Care Management Important Message (Signed)
Important Message  Patient Details  Name: Makayla Leach MRN: [id number] Date of Birth: 31-Oct-1950   Medicare Important Message Given:  Yes     Renie Ora 08/28/2021, 8:56 AM

## 2021-08-28 NOTE — Evaluation (Signed)
Physical Therapy Evaluation Patient Details Name: Makayla Leach MRN: [id number] DOB: 1951-07-23 Today's Date: 08/28/2021  History of Present Illness  Pt is a 70 y.o. female who presented 08/23/21 with SOB. Pt admitted with acute on chronic hypoxic respiratory failure due to acute on chronic diastolic CHF likely from medication noncompliance. PMH: aortic stenosis, CHF, hypertension, COPD, diabetes, PVD   Clinical Impression  Pt presents with condition above and deficits mentioned below, see PT Problem List. PTA, she was mod I using a rollator for mobility, living with her husband in a mobile home with a ramped entrance option. Currently, pt displays deficits in activity tolerance, aerobic endurance, and balance. Pt is able to perform all functional mobility safely at a supervision-mod I level using a RW (likely could manage rollator at home safely). Educated pt on energy conservation techniques, limiting sodium and processed foods intake, increasing frequency of activity, and monitoring her SpO2 levels. At this time, pt is requiring 1-2L of supplemental O2 at rest and when ambulating to maintain SpO2 >/= 90%. Pt is normally on RA during the day at home, but uses supplemental O2 when sleeping. Will continue to follow acutely to further promote return to baseline prior to d/c home. No further PT services needed after d/c.     Recommendations for follow up therapy are one component of a multi-disciplinary discharge planning process, led by the attending physician.  Recommendations may be updated based on patient status, additional functional criteria and insurance authorization.  Follow Up Recommendations No PT follow up    Assistance Recommended at Discharge PRN  Functional Status Assessment Patient has had a recent decline in their functional status and demonstrates the ability to make significant improvements in function in a reasonable and predictable amount of time.  Equipment Recommendations   None recommended by PT    Recommendations for Other Services       Precautions / Restrictions Precautions Precautions: Fall Precaution Comments: monitor SpO2 Restrictions Weight Bearing Restrictions: No      Mobility  Bed Mobility Overal bed mobility: Modified Independent             General bed mobility comments: Slightly increased time, but able to come up to sit L EOB with HOB elevated.    Transfers Overall transfer level: Modified independent Equipment used: Rolling walker (2 wheels)               General transfer comment: Able to come to stand safely without assistance or LOB from various surfaces.    Ambulation/Gait Ambulation/Gait assistance: Supervision Gait Distance (Feet): 500 Feet (x2 bouts of ~15 ft > ~500 ft) Assistive device: Rolling walker (2 wheels) Gait Pattern/deviations: Step-through pattern;Decreased stride length Gait velocity: reduced Gait velocity interpretation: <1.8 ft/sec, indicate of risk for recurrent falls   General Gait Details: Decreased speed, but steady, needing x2 standing rest breaks as pt fatigued. Supervision for safety.  Stairs            Wheelchair Mobility    Modified Rankin (Stroke Patients Only)       Balance Overall balance assessment: Needs assistance Sitting-balance support: No upper extremity supported;Feet supported Sitting balance-Leahy Scale: Good     Standing balance support: Reliant on assistive device for balance Standing balance-Leahy Scale: Poor Standing balance comment: Reliant on RW                             Pertinent Vitals/Pain Pain Assessment: Faces Faces Pain Scale:  No hurt Pain Intervention(s): Monitored during session    Home Living Family/patient expects to be discharged to:: Private residence Living Arrangements: Spouse/significant other Available Help at Discharge: Family;Available 24 hours/day Type of Home: Mobile home Home Access: Ramped entrance        Home Layout: One level Home Equipment: Rollator (4 wheels);Shower seat - built in;Hospital bed (does not use hospital bed) Additional Comments: Retired Quarry manager, 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 Denies any recent falls.       Hand Dominance        Extremity/Trunk Assessment   Upper Extremity Assessment Upper Extremity Assessment: Overall WFL for tasks assessed (MMT scores of 4+ to 5 grossly; denies numbness/tingling)    Lower Extremity Assessment Lower Extremity Assessment: Overall WFL for tasks assessed (MMT scores of 4+ to 5 grossly; denies numbness/tingling)    Cervical / Trunk Assessment Cervical / Trunk Assessment: Normal  Communication   Communication: No difficulties  Cognition Arousal/Alertness: Awake/alert Behavior During Therapy: WFL for tasks assessed/performed Overall Cognitive Status: Within Functional Limits for tasks assessed                                          General Comments General comments (skin integrity, edema, etc.): SpO2 88% on RA at rest, 83% on RA ambulating, 90-96% on 1-2L ambulating and at rest; educated pt on energy conservation techniques, increasing frequency of activity, and reducing sodium and processed foods intake    Exercises     Assessment/Plan    PT Assessment Patient needs continued PT services  PT Problem List Decreased activity tolerance;Decreased mobility;Cardiopulmonary status limiting activity       PT Treatment Interventions DME instruction;Gait training;Functional mobility training;Stair training;Therapeutic activities;Therapeutic exercise;Balance training;Neuromuscular re-education;Patient/family education    PT Goals (Current goals can be found in the Care Plan section)  Acute Rehab PT Goals Patient Stated Goal: to go home today PT Goal Formulation: With  patient/family Time For Goal Achievement: 09/04/21 Potential to Achieve Goals: Good    Frequency Min 3X/week   Barriers to discharge        Co-evaluation               AM-PAC PT "6 Clicks" Mobility  Outcome Measure Help needed turning from your back to your side while in a flat bed without using bedrails?: None Help needed moving from lying on your back to sitting on the side of a flat bed without using bedrails?: None Help needed moving to and from a bed to a chair (including a wheelchair)?: None Help needed standing up from a chair using your arms (e.g., wheelchair or bedside chair)?: None Help needed to walk in hospital room?: A Little Help needed climbing 3-5 steps with a railing? : A Little 6 Click Score: 22    End of Session Equipment Utilized During Treatment: Oxygen Activity Tolerance: Patient tolerated treatment well Patient left: in bed;with call bell/phone within reach;with family/visitor present Nurse Communication: Mobility status;Other (comment) (sats) PT Visit Diagnosis: Other abnormalities of gait and mobility (R26.89);Difficulty in walking, not elsewhere classified (R26.2)    Time: [id number] PT Time Calculation (min) (ACUTE ONLY): 45 min   Charges:   PT Evaluation $PT Eval Moderate Complexity: 1 Mod PT  Treatments $Gait Training: 8-22 mins $Therapeutic Activity: 8-22 mins        Raymond Gurney, PT, DPT Acute Rehabilitation Services  Pager: [phone] Office: [phone]   Jewel Baize 08/28/2021, 10:06 AM

## 2021-09-02 DIAGNOSIS — I509 Heart failure, unspecified: Secondary | ICD-10-CM | POA: Diagnosis not present

## 2021-09-02 DIAGNOSIS — J449 Chronic obstructive pulmonary disease, unspecified: Secondary | ICD-10-CM | POA: Diagnosis not present

## 2021-09-02 DIAGNOSIS — J9611 Chronic respiratory failure with hypoxia: Secondary | ICD-10-CM | POA: Diagnosis not present

## 2021-09-02 DIAGNOSIS — Z9981 Dependence on supplemental oxygen: Secondary | ICD-10-CM | POA: Diagnosis not present

## 2021-09-08 ENCOUNTER — Ambulatory Visit: Payer: Medicare Other | Admitting: Cardiology

## 2021-09-08 ENCOUNTER — Other Ambulatory Visit: Payer: Self-pay

## 2021-09-08 VITALS — BP 136/84 | HR 65 | Ht 61.5 in | Wt 198.4 lb

## 2021-09-08 DIAGNOSIS — I480 Paroxysmal atrial fibrillation: Secondary | ICD-10-CM | POA: Diagnosis not present

## 2021-09-08 DIAGNOSIS — I350 Nonrheumatic aortic (valve) stenosis: Secondary | ICD-10-CM | POA: Diagnosis not present

## 2021-09-08 DIAGNOSIS — I5042 Chronic combined systolic (congestive) and diastolic (congestive) heart failure: Secondary | ICD-10-CM | POA: Diagnosis not present

## 2021-09-08 DIAGNOSIS — I10 Essential (primary) hypertension: Secondary | ICD-10-CM

## 2021-09-08 DIAGNOSIS — E119 Type 2 diabetes mellitus without complications: Secondary | ICD-10-CM

## 2021-09-08 NOTE — Patient Instructions (Signed)
Medication Instructions:  Your physician recommends that you continue on your current medications as directed. Please refer to the Current Medication list given to you today.  *If you need a refill on your cardiac medications before your next appointment, please call your pharmacy*   Lab Work: Your physician recommends that you return for lab work in: Today for CBC, BMP, Pro BNP and Stool Test  If you have labs (blood work) drawn today and your tests are completely normal, you will receive your results only by: MyChart Message (if you have MyChart) OR A paper copy in the mail If you have any lab test that is abnormal or we need to change your treatment, we will call you to review the results.   Testing/Procedures: NONE   Follow-Up: At [facility], you and your health needs are our priority.  As part of our continuing mission to provide you with exceptional heart care, we have created designated Provider Care Teams.  These Care Teams include your primary Cardiologist (physician) and Advanced Practice Providers (APPs -  Physician Assistants and Nurse Practitioners) who all work together to provide you with the care you need, when you need it.  We recommend signing up for the patient portal called "MyChart".  Sign up information is provided on this After Visit Summary.  MyChart is used to connect with patients for Virtual Visits (Telemedicine).  Patients are able to view lab/test results, encounter notes, upcoming appointments, etc.  Non-urgent messages can be sent to your provider as well.   To learn more about what you can do with MyChart, go to [email or web address].    Your next appointment:   5 month(s)  The format for your next appointment:   In Person  Provider:   Gypsy Balsam, MD    Other Instructions

## 2021-09-08 NOTE — Progress Notes (Signed)
Cardiology Office Note:    Date:  09/08/2021   ID:  Jalysa, Swopes 1950/10/10, MRN [id number]  PCP:  Lise Auer, MD  Cardiologist:  Gypsy Balsam, MD    Referring MD: Lise Auer, MD   Chief Complaint  Patient presents with   Follow-up  I was in the hospital and I lost my daughter  History of Present Illness:    Makayla Leach is a 70 y.o. female with past medical history significant for paroxysmal atrial fibrillation, essential hypertension aortic stenosis which is mild to moderate, she is a chronic smoker, she was refusing anticoagulation. Recently she ended going to the hospital because of shortness of breath she was found to have severe diastolic congestive heart failure was diuresed and overall improved quite significantly and she is today in my office to follow-up.  Overall she is doing better she walks around with a walker to use oxygen recently she lost her daughter and obviously she is grieving after that.  Past Medical History:  Diagnosis Date   Aortic stenosis    Aortic stenosis, mild 03/07/2016   Benign essential hypertension 06/24/2015   CHF (congestive heart failure) (HCC)    COPD (chronic obstructive pulmonary disease) (HCC)    Diabetes mellitus without complication (HCC)    Hypertension    LAE (left atrial enlargement)    Left-sided epistaxis 01/14/2018   Paroxysmal atrial fibrillation (HCC)    Paroxysmal atrial flutter (HCC) 06/24/2015   Peripheral vascular disease (HCC)    Smoking 06/24/2015   Type 2 diabetes mellitus without complication (HCC) 06/24/2015    Past Surgical History:  Procedure Laterality Date   TONSILLECTOMY     TUBAL LIGATION      Current Medications: Current Meds  Medication Sig   albuterol (ACCUNEB) 0.63 MG/3ML nebulizer solution Take 0.63 mg by nebulization every 6 (six) hours as needed for wheezing.   albuterol (PROVENTIL HFA;VENTOLIN HFA) 108 (90 Base) MCG/ACT inhaler Inhale 2 puffs into the lungs every 6 (six) hours as  needed for wheezing or shortness of breath.   amLODipine-benazepril (LOTREL) 5-20 MG capsule Take 1 capsule by mouth daily.   aspirin EC 81 MG tablet Take 2 tablets (162 mg total) by mouth daily.   empagliflozin (JARDIANCE) 10 MG TABS tablet Take 10 mg by mouth daily.   flecainide (TAMBOCOR) 50 MG tablet TAKE ONE TABLET TWICE DAILY (Patient taking differently: Take 50 mg by mouth 2 (two) times daily.)   furosemide (LASIX) 40 MG tablet Take 1 tablet (40 mg total) by mouth daily.   metoprolol succinate (TOPROL-XL) 50 MG 24 hr tablet Take 50 mg by mouth 2 (two) times daily.   potassium chloride (KLOR-CON) 10 MEQ tablet TAKE ONE TABLET EVERY DAY (Patient taking differently: Take 10 mEq by mouth daily.)     Allergies:   Lipitor [atorvastatin calcium]   Social History   Socioeconomic History   Marital status: Married    Spouse name: Not on file   Number of children: Not on file   Years of education: Not on file   Highest education level: Not on file  Occupational History   Not on file  Tobacco Use   Smoking status: Every Day   Smokeless tobacco: Never  Vaping Use   Vaping Use: Never used  Substance and Sexual Activity   Alcohol use: No   Drug use: No   Sexual activity: Yes  Other Topics Concern   Not on file  Social History Narrative   Not  on file   Social Determinants of Health   Financial Resource Strain: Not on file  Food Insecurity: Not on file  Transportation Needs: Not on file  Physical Activity: Not on file  Stress: Not on file  Social Connections: Not on file     Family History: The patient's family history includes Diabetes in her brother; Heart disease in her father and mother; Hypertension in her brother, father, and mother. ROS:   Please see the history of present illness.    All 14 point review of systems negative except as described per history of present illness  EKGs/Labs/Other Studies Reviewed:      Recent Labs: 08/23/2021: B Natriuretic Peptide  1,554.6 08/27/2021: Hemoglobin 16.7; Platelets 232 08/28/2021: BUN 23; Creatinine, Ser 1.04; Magnesium 1.8; Potassium 4.2; Sodium 135  Recent Lipid Panel    Component Value Date/Time   CHOL 168 02/03/2021 1036   TRIG 119 02/03/2021 1036   HDL 36 (L) 02/03/2021 1036   CHOLHDL 4.7 (H) 02/03/2021 1036   LDLCALC 110 (H) 02/03/2021 1036    Physical Exam:    VS:  BP 136/84 (BP Location: Left Arm, Patient Position: Sitting)    Pulse 65    Ht 5' 1.5" (1.562 m)    Wt 198 lb 6.4 oz (90 kg)    LMP  (LMP Unknown)    SpO2 91%    BMI 36.88 kg/m     Wt Readings from Last 3 Encounters:  09/08/21 198 lb 6.4 oz (90 kg)  08/27/21 195 lb 8 oz (88.7 kg)  04/14/21 212 lb (96.2 kg)     GEN:  Well nourished, well developed in no acute distress HEENT: Normal NECK: No JVD; No carotid bruits LYMPHATICS: No lymphadenopathy CARDIAC: RRR, no murmurs, no rubs, no gallops RESPIRATORY:  Clear to auscultation without rales, wheezing or rhonchi  ABDOMEN: Soft, non-tender, non-distended MUSCULOSKELETAL:  No edema; No deformity  SKIN: Warm and dry LOWER EXTREMITIES: no swelling NEUROLOGIC:  Alert and oriented x 3 PSYCHIATRIC:  Normal affect   ASSESSMENT:    1. Aortic stenosis, mild   2. Benign essential hypertension   3. Paroxysmal atrial fibrillation (HCC)   4. Chronic combined systolic and diastolic congestive heart failure (HCC)   5. Type 2 diabetes mellitus without complication, without long-term current use of insulin (HCC)    PLAN:    In order of problems listed above:  Aortic stenosis mild to moderate based on last echocardiogram that I did review it was done in the hospital. Benign essential hypertension, blood pressure seems well controlled continue present management. Paroxysmal atrial fibrillation she was refusing anticoagulation but today he again I approach her with this issue and she agreed therefore, we will check a Chem-7 CBC stool for guaiac, if it is negative will initiate  anticoagulation. Chronic diastolic congestive heart for appears to be compensated today we will continue diuretics.  Previously the reason for decompensation was lack of compliance this time she said she takes medication on the regular basis. Type 2 diabetes followed by antimedicine team.  Last hemoglobin A1c-6.6 which is quite good.   Medication Adjustments/Labs and Tests Ordered: Current medicines are reviewed at length with the patient today.  Concerns regarding medicines are outlined above.  No orders of the defined types were placed in this encounter.  Medication changes: No orders of the defined types were placed in this encounter.   Signed, Georgeanna Lea, MD, [facility] 09/08/2021 2:14 PM    [facility]

## 2021-09-09 LAB — BASIC METABOLIC PANEL
BUN/Creatinine Ratio: 17 (ref 12–28)
BUN: 14 mg/dL (ref 8–27)
CO2: 28 mmol/L (ref 20–29)
Calcium: 9.6 mg/dL (ref 8.7–10.3)
Chloride: 99 mmol/L (ref 96–106)
Creatinine, Ser: 0.83 mg/dL (ref 0.57–1.00)
Glucose: 97 mg/dL (ref 70–99)
Potassium: 4.3 mmol/L (ref 3.5–5.2)
Sodium: 143 mmol/L (ref 134–144)
eGFR: 77 mL/min/{1.73_m2} (ref >59)

## 2021-09-09 LAB — CBC
Hematocrit: 51 % — ABNORMAL HIGH (ref 34.0–46.6)
Hemoglobin: 16.9 g/dL — ABNORMAL HIGH (ref 11.1–15.9)
MCH: 29.5 pg (ref 26.6–33.0)
MCHC: 33.1 g/dL (ref 31.5–35.7)
MCV: 89 fL (ref 79–97)
Platelets: 229 10*3/uL (ref 150–450)
RBC: 5.72 x10E6/uL — ABNORMAL HIGH (ref 3.77–5.28)
RDW: 12.7 % (ref 11.7–15.4)
WBC: 8.6 10*3/uL (ref 3.4–10.8)

## 2021-09-09 LAB — PRO B NATRIURETIC PEPTIDE: NT-Pro BNP: 1368 pg/mL — ABNORMAL HIGH (ref 0–301)

## 2021-09-12 ENCOUNTER — Telehealth: Payer: Self-pay

## 2021-09-12 MED ORDER — FUROSEMIDE 40 MG PO TABS: 60.0000 mg | ORAL_TABLET | Freq: Every day | ORAL | 3 refills | Status: DC

## 2021-09-12 NOTE — Telephone Encounter (Signed)
Spoke with patient regarding results and recommendation.  Patient verbalizes understanding and is agreeable to plan of care. Advised patient to call back with any issues or concerns.  

## 2021-09-12 NOTE — Telephone Encounter (Signed)
-----   Message from Georgeanna Lea, MD sent at 09/11/2021  9:27 AM EST ----- Please increase furosemide to 60 mg daily everything else will be the same

## 2021-09-26 DIAGNOSIS — N3091 Cystitis, unspecified with hematuria: Secondary | ICD-10-CM | POA: Diagnosis not present

## 2021-09-26 DIAGNOSIS — E1165 Type 2 diabetes mellitus with hyperglycemia: Secondary | ICD-10-CM | POA: Diagnosis not present

## 2022-02-09 ENCOUNTER — Ambulatory Visit: Payer: Medicare Other | Admitting: Cardiology

## 2022-02-26 ENCOUNTER — Other Ambulatory Visit: Payer: Self-pay | Admitting: Cardiology

## 2022-02-26 NOTE — Telephone Encounter (Signed)
Flecainide 50 mg # 60 tablets x 6 refills sent to [facility] - [place], [place] - [place], [place] - [address]

## 2022-03-30 DIAGNOSIS — E782 Mixed hyperlipidemia: Secondary | ICD-10-CM | POA: Diagnosis not present

## 2022-03-30 DIAGNOSIS — E1169 Type 2 diabetes mellitus with other specified complication: Secondary | ICD-10-CM | POA: Diagnosis not present

## 2022-03-30 DIAGNOSIS — J9611 Chronic respiratory failure with hypoxia: Secondary | ICD-10-CM | POA: Diagnosis not present

## 2022-03-30 DIAGNOSIS — Z9981 Dependence on supplemental oxygen: Secondary | ICD-10-CM | POA: Diagnosis not present

## 2022-05-14 ENCOUNTER — Ambulatory Visit: Payer: Medicare Other | Admitting: Cardiology

## 2022-07-30 ENCOUNTER — Ambulatory Visit: Payer: Medicare Other | Admitting: Cardiology

## 2022-08-26 ENCOUNTER — Other Ambulatory Visit: Payer: Self-pay | Admitting: Cardiology

## 2022-09-04 ENCOUNTER — Other Ambulatory Visit: Payer: Self-pay | Admitting: Cardiology

## 2022-09-07 ENCOUNTER — Other Ambulatory Visit: Payer: Self-pay | Admitting: Cardiology

## 2022-09-07 NOTE — Telephone Encounter (Signed)
Rx refill sent to pharmacy. 

## 2022-09-24 ENCOUNTER — Other Ambulatory Visit: Payer: Self-pay | Admitting: Cardiology

## 2022-09-24 NOTE — Telephone Encounter (Signed)
Rx refill sent to pharmacy. 

## 2022-10-26 ENCOUNTER — Encounter: Payer: Self-pay | Admitting: Cardiology

## 2022-10-26 ENCOUNTER — Ambulatory Visit: Payer: Medicare HMO | Attending: Cardiology | Admitting: Cardiology

## 2022-10-26 VITALS — BP 220/100 | HR 65 | Ht 61.5 in | Wt 199.2 lb

## 2022-10-26 DIAGNOSIS — I5042 Chronic combined systolic (congestive) and diastolic (congestive) heart failure: Secondary | ICD-10-CM | POA: Diagnosis not present

## 2022-10-26 DIAGNOSIS — I10 Essential (primary) hypertension: Secondary | ICD-10-CM | POA: Diagnosis not present

## 2022-10-26 DIAGNOSIS — R0609 Other forms of dyspnea: Secondary | ICD-10-CM

## 2022-10-26 DIAGNOSIS — E785 Hyperlipidemia, unspecified: Secondary | ICD-10-CM

## 2022-10-26 DIAGNOSIS — I350 Nonrheumatic aortic (valve) stenosis: Secondary | ICD-10-CM | POA: Diagnosis not present

## 2022-10-26 NOTE — Patient Instructions (Addendum)
Medication Instructions:  Your physician recommends that you continue on your current medications as directed. Please refer to the Current Medication list given to you today.  *If you need a refill on your cardiac medications before your next appointment, please call your pharmacy*   Lab Work: Lipid, CBC, Stool for blood- today If you have labs (blood work) drawn today and your tests are completely normal, you will receive your results only by: [place] (if you have MyChart) OR A paper copy in the mail If you have any lab test that is abnormal or we need to change your treatment, we will call you to review the results.   Testing/Procedures: Your physician has requested that you have an echocardiogram. Echocardiography is a painless test that uses sound waves to create images of your heart. It provides your doctor with information about the size and shape of your heart and how well your heart's chambers and valves are working. This procedure takes approximately one hour. There are no restrictions for this procedure. Please do NOT wear cologne, perfume, aftershave, or lotions (deodorant is allowed). Please arrive 15 minutes prior to your appointment time.    Follow-Up: At [facility], you and your health needs are our priority.  As part of our continuing mission to provide you with exceptional heart care, we have created designated Provider Care Teams.  These Care Teams include your primary Cardiologist (physician) and Advanced Practice Providers (APPs -  Physician Assistants and Nurse Practitioners) who all work together to provide you with the care you need, when you need it.  We recommend signing up for the patient portal called "MyChart".  Sign up information is provided on this After Visit Summary.  MyChart is used to connect with patients for Virtual Visits (Telemedicine).  Patients are able to view lab/test results, encounter notes, upcoming appointments, etc.  Non-urgent  messages can be sent to your provider as well.   To learn more about what you can do with MyChart, go to [email or web address].    Your next appointment:   3 month  The format for your next appointment:   In Person  Provider:   Jenne Campus, MD    Other Instructions 2-3 week follow up - Nurse Visit for BP

## 2022-10-26 NOTE — Progress Notes (Signed)
Cardiology Office Note:    Date:  10/26/2022   ID:  Makayla Leach, Makayla Leach July 10, 1951, MRN [id number]  PCP:  Mateo Flow, MD  Cardiologist:  Jenne Campus, MD    Referring MD: Mateo Flow, MD   Chief Complaint  Patient presents with   Follow-up    History of Present Illness:    Makayla Leach is a 72 y.o. female with past medical history significant for paroxysmal atrial fibrillation, essential hypertension, aortic stenosis which last assessment was done in December 2022 as mild to moderate, she is a chronic smoker still continues to smoke, last time we started talking again about anticoagulation she accepted the offer however I do not see her on anticoagulation.  Interestingly I have not seen him for more than a year she simply disappeared from follow-up.  Now she comes back.  She seems to be doing well.  She denies have any chest pain tightness squeezing pressure burning chest no palpitations dizziness swelling of lower extremities  Past Medical History:  Diagnosis Date   Aortic stenosis    Aortic stenosis, mild 03/07/2016   Benign essential hypertension 06/24/2015   CHF (congestive heart failure) (HCC)    COPD (chronic obstructive pulmonary disease) (HCC)    Diabetes mellitus without complication (HCC)    Hypertension    LAE (left atrial enlargement)    Left-sided epistaxis 01/14/2018   Paroxysmal atrial fibrillation (HCC)    Paroxysmal atrial flutter ([place]) 06/24/2015   Peripheral vascular disease ([place])    Smoking 06/24/2015   Type 2 diabetes mellitus without complication ([place]) 0000000    Past Surgical History:  Procedure Laterality Date   TONSILLECTOMY     TUBAL LIGATION      Current Medications: Current Meds  Medication Sig   albuterol (PROVENTIL HFA;VENTOLIN HFA) 108 (90 Base) MCG/ACT inhaler Inhale 2 puffs into the lungs every 6 (six) hours as needed for wheezing or shortness of breath.   amLODipine-benazepril (LOTREL) 5-20 MG capsule TAKE ONE CAPSULE BY MOUTH  EVERY DAY   aspirin EC 81 MG tablet Take 2 tablets (162 mg total) by mouth daily.   empagliflozin (JARDIANCE) 10 MG TABS tablet Take 10 mg by mouth daily.   flecainide (TAMBOCOR) 50 MG tablet TAKE ONE TABLET BY MOUTH TWICE DAILY   furosemide (LASIX) 40 MG tablet Take 1.5 tablets (60 mg total) by mouth daily.   metoprolol succinate (TOPROL-XL) 50 MG 24 hr tablet Take 50 mg by mouth 2 (two) times daily.   potassium chloride (KLOR-CON) 10 MEQ tablet TAKE ONE TABLET EVERY DAY (Patient taking differently: Take 10 mEq by mouth daily.)     Allergies:   Lipitor [atorvastatin calcium]   Social History   Socioeconomic History   Marital status: Married    Spouse name: Not on file   Number of children: Not on file   Years of education: Not on file   Highest education level: Not on file  Occupational History   Not on file  Tobacco Use   Smoking status: Every Day   Smokeless tobacco: Never  Vaping Use   Vaping Use: Never used  Substance and Sexual Activity   Alcohol use: No   Drug use: No   Sexual activity: Yes  Other Topics Concern   Not on file  Social History Narrative   Not on file   Social Determinants of Health   Financial Resource Strain: Not on file  Food Insecurity: Not on file  Transportation Needs: Not on file  Physical Activity: Not on file  Stress: Not on file  Social Connections: Not on file     Family History: The patient's family history includes Diabetes in her brother; Heart disease in her father and mother; Hypertension in her brother, father, and mother. ROS:   Please see the history of present illness.    All 14 point review of systems negative except as described per history of present illness  EKGs/Labs/Other Studies Reviewed:      Recent Labs: No results found for requested labs within last 365 days.  Recent Lipid Panel    Component Value Date/Time   CHOL 168 02/03/2021 1036   TRIG 119 02/03/2021 1036   HDL 36 (L) 02/03/2021 1036   CHOLHDL 4.7  (H) 02/03/2021 1036   LDLCALC 110 (H) 02/03/2021 1036    Physical Exam:    VS:  BP (!) 220/100 (BP Location: Left Arm, Patient Position: Sitting)   Pulse 65   Ht 5' 1.5" (1.562 m)   Wt 199 lb 3.2 oz (90.4 kg)   LMP  (LMP Unknown)   SpO2 95%   BMI 37.03 kg/m     Wt Readings from Last 3 Encounters:  10/26/22 199 lb 3.2 oz (90.4 kg)  09/08/21 198 lb 6.4 oz (90 kg)  08/27/21 195 lb 8 oz (88.7 kg)     GEN:  Well nourished, well developed in no acute distress HEENT: Normal NECK: No JVD; No carotid bruits LYMPHATICS: No lymphadenopathy CARDIAC: RRR, systolic ejection murmur grade 2/6 best heard right upper portion of the sternum, no rubs, no gallops RESPIRATORY:  Clear to auscultation without rales, wheezing or rhonchi  ABDOMEN: Soft, non-tender, non-distended MUSCULOSKELETAL:  No edema; No deformity  SKIN: Warm and dry LOWER EXTREMITIES: no swelling NEUROLOGIC:  Alert and oriented x 3 PSYCHIATRIC:  Normal affect   ASSESSMENT:    1. Nonrheumatic aortic valve stenosis   2. Benign essential hypertension   3. Chronic combined systolic and diastolic congestive heart failure (HCC)    PLAN:    In order of problems listed above:  Aortic valve stenosis.  Will reassess by doing echocardiogram.  It was mild to moderate year ago so hopefully did not progress to severe level however that need to be verified. Essential hypertension clearly uncontrolled, however, she told me she did not take her medications today.  I asked him to take medication on the regular basis will do nurses visit in about 2 weeks to see what her blood pressure is like. Paroxysmal atrial fibrillation, maintained sinus rhythm.  She accepted my offer of starting anticoagulation.  I will check CBC Chem-7 stool for guaiac and then will decide if he can go with probably Eliquis 5 mg twice daily. Chronic combined systolic and diastolic congestive heart failure.  Echocardiogram will be done, she will look compensated on the  physical exam. COPD which is advanced, still ongoing.  She said that she still continues to smoke, we spent at least 5 minutes talking about this I gave her all different things about quitting.  Hopefully she will be able to accomplish that goal  I was chaperoned by my nurse assistant Prevatt.  During entire visit   Medication Adjustments/Labs and Tests Ordered: Current medicines are reviewed at length with the patient today.  Concerns regarding medicines are outlined above.  No orders of the defined types were placed in this encounter.  Medication changes: No orders of the defined types were placed in this encounter.   Signed, Park Liter, MD,  [facility] 10/26/2022 11:10 AM    [facility]

## 2022-10-27 LAB — CBC
Hematocrit: 56.8 % — ABNORMAL HIGH (ref 34.0–46.6)
Hemoglobin: 18.8 g/dL — ABNORMAL HIGH (ref 11.1–15.9)
MCH: 30.3 pg (ref 26.6–33.0)
MCHC: 33.1 g/dL (ref 31.5–35.7)
MCV: 92 fL (ref 79–97)
Platelets: 165 10*3/uL (ref 150–450)
RBC: 6.21 x10E6/uL — ABNORMAL HIGH (ref 3.77–5.28)
RDW: 13.1 % (ref 11.7–15.4)
WBC: 8.2 10*3/uL (ref 3.4–10.8)

## 2022-10-27 LAB — LIPID PANEL
Chol/HDL Ratio: 4.6 ratio — ABNORMAL HIGH (ref 0.0–4.4)
Cholesterol, Total: 142 mg/dL (ref 100–199)
HDL: 31 mg/dL — ABNORMAL LOW (ref >39)
LDL Chol Calc (NIH): 97 mg/dL (ref 0–99)
Triglycerides: 71 mg/dL (ref 0–149)
VLDL Cholesterol Cal: 14 mg/dL (ref 5–40)

## 2022-10-29 ENCOUNTER — Telehealth: Payer: Self-pay

## 2022-10-29 DIAGNOSIS — E785 Hyperlipidemia, unspecified: Secondary | ICD-10-CM

## 2022-10-29 MED ORDER — ATORVASTATIN CALCIUM 10 MG PO TABS: 10.0000 mg | ORAL_TABLET | Freq: Every day | ORAL | 3 refills | Status: DC

## 2022-10-29 NOTE — Telephone Encounter (Signed)
Spoke with pt about lab results she agreed to start Lipitor 10mg  1 q d and follow up in 6 weeks for Lipid, AST, ALT. She had no further questions.

## 2022-11-09 ENCOUNTER — Telehealth: Payer: Self-pay | Admitting: Cardiology

## 2022-11-09 ENCOUNTER — Ambulatory Visit: Payer: Medicare HMO

## 2022-11-09 NOTE — Telephone Encounter (Signed)
Pt called in to cancel her RN visit due to having another appt as well this day. Pt states she would like to r/s

## 2022-11-11 LAB — FECAL OCCULT BLOOD, IMMUNOCHEMICAL: Fecal Occult Bld: NEGATIVE

## 2022-11-26 ENCOUNTER — Telehealth: Payer: Self-pay

## 2022-11-26 MED ORDER — APIXABAN 5 MG PO TABS: 5.0000 mg | ORAL_TABLET | Freq: Two times a day (BID) | ORAL | 6 refills | Status: DC

## 2022-11-26 NOTE — Telephone Encounter (Signed)
Pt called to discuss labs. Advised per Dr. Wendy Poet note that she should stop Aspirin and start Eliquis '5mg'$  1 bid. Pt agreed and verbalized understanding. Sent prescription to [facility].

## 2022-11-28 ENCOUNTER — Other Ambulatory Visit: Payer: Self-pay | Admitting: Cardiology

## 2022-11-30 ENCOUNTER — Telehealth: Payer: Self-pay | Admitting: Cardiology

## 2022-11-30 NOTE — Telephone Encounter (Signed)
Pt PCP called office today on behalf of pt. She states pt was informed she needs to start two new medications, the eliquis and something else she could not recall. She asks that someone give pt a call to clarify.

## 2022-11-30 NOTE — Telephone Encounter (Addendum)
Spoke with pt about her medications. She was not sure which medications were new. Advised that Dr. Agustin Cree had started her on Atorvastatin '10mg'$  q d  and Eliquis '5mg'$  Bid. Pt verbalized understanding and had no further questions.

## 2022-12-04 ENCOUNTER — Other Ambulatory Visit: Payer: Self-pay | Admitting: Cardiology

## 2022-12-04 ENCOUNTER — Encounter: Payer: Self-pay | Admitting: Family Medicine

## 2022-12-08 NOTE — Telephone Encounter (Signed)
Unable to leave a message as recording ask for access code.

## 2022-12-08 NOTE — Telephone Encounter (Signed)
Patient is returning call.  °

## 2022-12-14 ENCOUNTER — Ambulatory Visit: Payer: Medicare HMO | Attending: Family Medicine

## 2023-03-01 ENCOUNTER — Ambulatory Visit: Payer: Medicare HMO | Attending: Cardiology | Admitting: Cardiology

## 2023-03-01 ENCOUNTER — Encounter: Payer: Self-pay | Admitting: Cardiology

## 2023-03-01 VITALS — BP 144/70 | HR 63 | Ht 61.5 in | Wt 184.0 lb

## 2023-03-01 DIAGNOSIS — I480 Paroxysmal atrial fibrillation: Secondary | ICD-10-CM

## 2023-03-01 DIAGNOSIS — J449 Chronic obstructive pulmonary disease, unspecified: Secondary | ICD-10-CM | POA: Diagnosis not present

## 2023-03-01 DIAGNOSIS — I10 Essential (primary) hypertension: Secondary | ICD-10-CM

## 2023-03-01 DIAGNOSIS — I350 Nonrheumatic aortic (valve) stenosis: Secondary | ICD-10-CM

## 2023-03-01 NOTE — Addendum Note (Signed)
Addended by: Heywood Bene on: 03/01/2023 12:23 PM   Modules accepted: Orders

## 2023-03-01 NOTE — Patient Instructions (Signed)
Medication Instructions:  Your physician recommends that you continue on your current medications as directed. Please refer to the Current Medication list given to you today.  *If you need a refill on your cardiac medications before your next appointment, please call your pharmacy*   Lab Work: None If you have labs (blood work) drawn today and your tests are completely normal, you will receive your results only by: MyChart Message (if you have MyChart) OR A paper copy in the mail If you have any lab test that is abnormal or we need to change your treatment, we will call you to review the results.   Testing/Procedures: Your physician has requested that you have an echocardiogram. Echocardiography is a painless test that uses sound waves to create images of your heart. It provides your doctor with information about the size and shape of your heart and how well your heart's chambers and valves are working. This procedure takes approximately one hour. There are no restrictions for this procedure.    Follow-Up: At [facility], you and your health needs are our priority.  As part of our continuing mission to provide you with exceptional heart care, we have created designated Provider Care Teams.  These Care Teams include your primary Cardiologist (physician) and Advanced Practice Providers (APPs -  Physician Assistants and Nurse Practitioners) who all work together to provide you with the care you need, when you need it.  We recommend signing up for the patient portal called "MyChart".  Sign up information is provided on this After Visit Summary.  MyChart is used to connect with patients for Virtual Visits (Telemedicine).  Patients are able to view lab/test results, encounter notes, upcoming appointments, etc.  Non-urgent messages can be sent to your provider as well.   To learn more about what you can do with MyChart, go to [email or web address].     Other Instructions Echocardiogram An  echocardiogram is a test that uses sound waves (ultrasound) to produce images of the heart. Images from an echocardiogram can provide important information about: Heart size and shape. The size and thickness and movement of your heart's walls. Heart muscle function and strength. Heart valve function or if you have stenosis. Stenosis is when the heart valves are too narrow. If blood is flowing backward through the heart valves (regurgitation). A tumor or infectious growth around the heart valves. Areas of heart muscle that are not working well because of poor blood flow or injury from a heart attack. Aneurysm detection. An aneurysm is a weak or damaged part of an artery wall. The wall bulges out from the normal force of blood pumping through the body. Tell a health care provider about: Any allergies you have. All medicines you are taking, including vitamins, herbs, eye drops, creams, and over-the-counter medicines. Any blood disorders you have. Any surgeries you have had. Any medical conditions you have. Whether you are pregnant or may be pregnant. What are the risks? Generally, this is a safe test. However, problems may occur, including an allergic reaction to dye (contrast) that may be used during the test. What happens before the test? No specific preparation is needed. You may eat and drink normally. What happens during the test? You will take off your clothes from the waist up and put on a hospital gown. Electrodes or electrocardiogram (ECG)patches may be placed on your chest. The electrodes or patches are then connected to a device that monitors your heart rate and rhythm. You will lie down on a table for   an ultrasound exam. A gel will be applied to your chest to help sound waves pass through your skin. A handheld device, called a transducer, will be pressed against your chest and moved over your heart. The transducer produces sound waves that travel to your heart and bounce back (or  "echo" back) to the transducer. These sound waves will be captured in real-time and changed into images of your heart that can be viewed on a video monitor. The images will be recorded on a computer and reviewed by your health care provider. You may be asked to change positions or hold your breath for a short time. This makes it easier to get different views or better views of your heart. In some cases, you may receive contrast through an IV in one of your veins. This can improve the quality of the pictures from your heart. The procedure may vary among health care providers and hospitals.   What can I expect after the test? You may return to your normal, everyday life, including diet, activities, and medicines, unless your health care provider tells you not to do that. Follow these instructions at home: It is up to you to get the results of your test. Ask your health care provider, or the department that is doing the test, when your results will be ready. Keep all follow-up visits. This is important. Summary An echocardiogram is a test that uses sound waves (ultrasound) to produce images of the heart. Images from an echocardiogram can provide important information about the size and shape of your heart, heart muscle function, heart valve function, and other possible heart problems. You do not need to do anything to prepare before this test. You may eat and drink normally. After the echocardiogram is completed, you may return to your normal, everyday life, unless your health care provider tells you not to do that. This information is not intended to replace advice given to you by your health care provider. Make sure you discuss any questions you have with your health care provider. Document Revised: 04/30/2020 Document Reviewed: 04/30/2020 Elsevier Patient Education  2021 Elsevier Inc.      Follow-Up: At [facility], you and your health needs are our priority.  As part of our  continuing mission to provide you with exceptional heart care, we have created designated Provider Care Teams.  These Care Teams include your primary Cardiologist (physician) and Advanced Practice Providers (APPs -  Physician Assistants and Nurse Practitioners) who all work together to provide you with the care you need, when you need it.  We recommend signing up for the patient portal called "MyChart".  Sign up information is provided on this After Visit Summary.  MyChart is used to connect with patients for Virtual Visits (Telemedicine).  Patients are able to view lab/test results, encounter notes, upcoming appointments, etc.  Non-urgent messages can be sent to your provider as well.   To learn more about what you can do with MyChart, go to [email or web address].    Your next appointment:   3 month(s)  Provider:   Robert Krasowski, MD    Other Instructions   

## 2023-03-01 NOTE — Progress Notes (Signed)
Cardiology Office Note:    Date:  03/01/2023   ID:  Makayla, Leach 1950/12/12, MRN [id number]  PCP:  Lise Auer, MD  Cardiologist:  Gypsy Balsam, MD    Referring MD: Lise Auer, MD   Chief Complaint  Patient presents with   Follow-up    History of Present Illness:    Makayla Leach is a 72 y.o. female with past medical history significant for paroxysmal atrial fibrillation, essential hypertension, aortic stenosis with last assessment of December 2022 which was mild to moderate, she is a chronic smoker still continues to smoke, COPD.  She came today to my office to follow-up.  She said she is doing the same she is short of breath quite easily she was scheduled to have echocardiogram to assess aortic valve however it was not done.  Described to have some fatigue tiredness shortness of breath  Past Medical History:  Diagnosis Date   Aortic stenosis    Aortic stenosis, mild 03/07/2016   Benign essential hypertension 06/24/2015   CHF (congestive heart failure) (HCC)    COPD (chronic obstructive pulmonary disease) (HCC)    Diabetes mellitus without complication (HCC)    Hypertension    LAE (left atrial enlargement)    Left-sided epistaxis 01/14/2018   Paroxysmal atrial fibrillation (HCC)    Paroxysmal atrial flutter (HCC) 06/24/2015   Peripheral vascular disease (HCC)    Smoking 06/24/2015   Type 2 diabetes mellitus without complication (HCC) 06/24/2015    Past Surgical History:  Procedure Laterality Date   TONSILLECTOMY     TUBAL LIGATION      Current Medications: Current Meds  Medication Sig   albuterol (ACCUNEB) 0.63 MG/3ML nebulizer solution Take 0.63 mg by nebulization every 6 (six) hours as needed for wheezing.   albuterol (PROVENTIL HFA;VENTOLIN HFA) 108 (90 Base) MCG/ACT inhaler Inhale 2 puffs into the lungs every 6 (six) hours as needed for wheezing or shortness of breath.   amLODipine-benazepril (LOTREL) 5-20 MG capsule Take 1 capsule by mouth daily.    apixaban (ELIQUIS) 5 MG TABS tablet Take 1 tablet (5 mg total) by mouth 2 (two) times daily.   atorvastatin (LIPITOR) 10 MG tablet Take 1 tablet (10 mg total) by mouth daily.   empagliflozin (JARDIANCE) 10 MG TABS tablet Take 10 mg by mouth daily.   flecainide (TAMBOCOR) 50 MG tablet Take 1 tablet (50 mg total) by mouth 2 (two) times daily.   furosemide (LASIX) 40 MG tablet Take 1.5 tablets (60 mg total) by mouth daily.   metoprolol succinate (TOPROL-XL) 50 MG 24 hr tablet Take 50 mg by mouth 2 (two) times daily.   potassium chloride (KLOR-CON) 10 MEQ tablet Take 1 tablet (10 mEq total) by mouth daily.     Allergies:   Lipitor [atorvastatin calcium]   Social History   Socioeconomic History   Marital status: Married    Spouse name: Not on file   Number of children: Not on file   Years of education: Not on file   Highest education level: Not on file  Occupational History   Not on file  Tobacco Use   Smoking status: Every Day   Smokeless tobacco: Never  Vaping Use   Vaping Use: Never used  Substance and Sexual Activity   Alcohol use: No   Drug use: No   Sexual activity: Yes  Other Topics Concern   Not on file  Social History Narrative   Not on file   Social Determinants of Health  Financial Resource Strain: Not on file  Food Insecurity: Not on file  Transportation Needs: Not on file  Physical Activity: Not on file  Stress: Not on file  Social Connections: Not on file     Family History: The patient's family history includes Diabetes in her brother; Heart disease in her father and mother; Hypertension in her brother, father, and mother. ROS:   Please see the history of present illness.    All 14 point review of systems negative except as described per history of present illness  EKGs/Labs/Other Studies Reviewed:      Recent Labs: 10/26/2022: Hemoglobin 18.8; Platelets 165  Recent Lipid Panel    Component Value Date/Time   CHOL 142 10/26/2022 1125   TRIG 71  10/26/2022 1125   HDL 31 (L) 10/26/2022 1125   CHOLHDL 4.6 (H) 10/26/2022 1125   LDLCALC 97 10/26/2022 1125    Physical Exam:    VS:  BP (!) 144/70 (BP Location: Left Arm, Patient Position: Sitting)   Pulse 63   Ht 5' 1.5" (1.562 m)   Wt 184 lb (83.5 kg)   LMP  (LMP Unknown)   SpO2 92%   BMI 34.20 kg/m     Wt Readings from Last 3 Encounters:  03/01/23 184 lb (83.5 kg)  10/26/22 199 lb 3.2 oz (90.4 kg)  09/08/21 198 lb 6.4 oz (90 kg)     GEN:  Well nourished, well developed in no acute distress HEENT: Normal NECK: No JVD; No carotid bruits LYMPHATICS: No lymphadenopathy CARDIAC: RRR, systolic murmur murmur best heard left border sternum 2-3 in scale up to 6, no rubs, no gallops RESPIRATORY:  Clear to auscultation without rales, wheezing or rhonchi  ABDOMEN: Soft, non-tender, non-distended MUSCULOSKELETAL:  No edema; No deformity  SKIN: Warm and dry LOWER EXTREMITIES: no swelling NEUROLOGIC:  Alert and oriented x 3 PSYCHIATRIC:  Normal affect   ASSESSMENT:    1. Nonrheumatic aortic valve stenosis   2. Chronic obstructive pulmonary disease, unspecified COPD type (HCC)   3. Primary hypertension   4. Paroxysmal atrial fibrillation (HCC)    PLAN:    In order of problems listed above:  Nonrheumatic aortic valve stenosis.  Will make arrangements for echocardiogram to be done.  80 will be critically essential to understand if the valve is severely stenotic. COPD which obviously contributing to her symptomatology.  But first we need to rule out aortic stenosis and potential source of her symptoms. Essential hypertension lipid seems to well-controlled continue present management. Paroxysmal atrial fibrillation, she is maintaining sinus rhythm today with right bundle branch block.  Anticoagulated with Eliquis which I will continue   Medication Adjustments/Labs and Tests Ordered: Current medicines are reviewed at length with the patient today.  Concerns regarding medicines  are outlined above.  No orders of the defined types were placed in this encounter.  Medication changes: No orders of the defined types were placed in this encounter.   Signed, Georgeanna Lea, MD, [facility] 03/01/2023 11:54 AM    [facility]

## 2023-03-01 NOTE — Addendum Note (Signed)
Addended by: Heywood Bene on: 03/01/2023 04:26 PM   Modules accepted: Orders

## 2023-03-29 ENCOUNTER — Ambulatory Visit: Payer: Medicare HMO | Attending: Cardiology

## 2023-03-29 DIAGNOSIS — I10 Essential (primary) hypertension: Secondary | ICD-10-CM | POA: Diagnosis not present

## 2023-03-29 DIAGNOSIS — J449 Chronic obstructive pulmonary disease, unspecified: Secondary | ICD-10-CM

## 2023-03-29 DIAGNOSIS — I350 Nonrheumatic aortic (valve) stenosis: Secondary | ICD-10-CM | POA: Diagnosis not present

## 2023-03-29 DIAGNOSIS — I480 Paroxysmal atrial fibrillation: Secondary | ICD-10-CM | POA: Diagnosis not present

## 2023-03-29 LAB — ECHOCARDIOGRAM COMPLETE
AR max vel: 1.28 cm2
AV Area VTI: 1.47 cm2
AV Area mean vel: 1.28 cm2
AV Mean grad: 12.8 mmHg
AV Peak grad: 24 mmHg
Ao pk vel: 2.45 m/s
Area-P 1/2: 2.39 cm2
MV VTI: 1.54 cm2
P 1/2 time: 386 msec
S' Lateral: 3.4 cm

## 2023-04-01 ENCOUNTER — Telehealth: Payer: Self-pay

## 2023-04-01 NOTE — Telephone Encounter (Signed)
Results reviewed with pt as per Dr. Krasowski's note.  Pt verbalized understanding and had no additional questions. Routed to PCP  

## 2023-05-05 ENCOUNTER — Other Ambulatory Visit: Payer: Self-pay

## 2023-05-05 DIAGNOSIS — I480 Paroxysmal atrial fibrillation: Secondary | ICD-10-CM

## 2023-05-05 MED ORDER — APIXABAN 5 MG PO TABS: 5.0000 mg | ORAL_TABLET | Freq: Two times a day (BID) | ORAL | 1 refills | Status: DC

## 2023-05-05 NOTE — Telephone Encounter (Signed)
Prescription refill request for Eliquis received. Indication: Afib  Last office visit: 03/01/23 Bing Matter)  Scr: 1.0 (11/09/22)  Age: 72 Weight: 83.5kg  Appropriate dose. Refill sent.

## 2023-06-07 ENCOUNTER — Encounter: Payer: Self-pay | Admitting: Cardiology

## 2023-06-07 ENCOUNTER — Ambulatory Visit: Payer: Medicare HMO | Attending: Cardiology | Admitting: Cardiology

## 2023-06-07 VITALS — BP 180/80 | HR 90 | Ht 62.0 in | Wt 189.8 lb

## 2023-06-07 DIAGNOSIS — I480 Paroxysmal atrial fibrillation: Secondary | ICD-10-CM | POA: Diagnosis not present

## 2023-06-07 DIAGNOSIS — J449 Chronic obstructive pulmonary disease, unspecified: Secondary | ICD-10-CM

## 2023-06-07 DIAGNOSIS — F17200 Nicotine dependence, unspecified, uncomplicated: Secondary | ICD-10-CM

## 2023-06-07 DIAGNOSIS — I350 Nonrheumatic aortic (valve) stenosis: Secondary | ICD-10-CM | POA: Diagnosis not present

## 2023-06-07 DIAGNOSIS — I10 Essential (primary) hypertension: Secondary | ICD-10-CM | POA: Diagnosis not present

## 2023-06-07 NOTE — Patient Instructions (Signed)

## 2023-06-07 NOTE — Progress Notes (Unsigned)
Cardiology Office Note:    Date:  06/07/2023   ID:  Makayla Leach, Makayla Leach 1951/08/21, MRN [id number]  PCP:  Lise Auer, MD  Cardiologist:  Gypsy Balsam, MD    Referring MD: Lise Auer, MD   Chief Complaint  Patient presents with   Follow-up    History of Present Illness:    Makayla Leach is a 72 y.o. female with past medical history significant for paroxysmal atrial fibrillation, essential hypertension, aortic stenosis which is mild to moderate, she is a chronic smoker and advanced COPD.  Comes today to months for follow-up cardiac wise doing well.  Denies have any chest pain tightness squeezing pressure mid chest but she does describe to have some shortness of breath with exertion.  She is oxygen at home  Past Medical History:  Diagnosis Date   Aortic stenosis    Aortic stenosis, mild 03/07/2016   Benign essential hypertension 06/24/2015   CHF (congestive heart failure) (HCC)    COPD (chronic obstructive pulmonary disease) (HCC)    Diabetes mellitus without complication (HCC)    Hypertension    LAE (left atrial enlargement)    Left-sided epistaxis 01/14/2018   Paroxysmal atrial fibrillation (HCC)    Paroxysmal atrial flutter (HCC) 06/24/2015   Peripheral vascular disease (HCC)    Smoking 06/24/2015   Type 2 diabetes mellitus without complication (HCC) 06/24/2015    Past Surgical History:  Procedure Laterality Date   TONSILLECTOMY     TUBAL LIGATION      Current Medications: Current Meds  Medication Sig   albuterol (ACCUNEB) 0.63 MG/3ML nebulizer solution Take 0.63 mg by nebulization every 6 (six) hours as needed for wheezing.   albuterol (PROVENTIL HFA;VENTOLIN HFA) 108 (90 Base) MCG/ACT inhaler Inhale 2 puffs into the lungs every 6 (six) hours as needed for wheezing or shortness of breath.   amLODipine-benazepril (LOTREL) 5-20 MG capsule Take 1 capsule by mouth daily.   apixaban (ELIQUIS) 5 MG TABS tablet Take 1 tablet (5 mg total) by mouth 2 (two) times daily.    atorvastatin (LIPITOR) 10 MG tablet Take 1 tablet (10 mg total) by mouth daily.   empagliflozin (JARDIANCE) 10 MG TABS tablet Take 10 mg by mouth daily.   flecainide (TAMBOCOR) 50 MG tablet Take 1 tablet (50 mg total) by mouth 2 (two) times daily.   furosemide (LASIX) 40 MG tablet Take 1.5 tablets (60 mg total) by mouth daily.   metoprolol succinate (TOPROL-XL) 50 MG 24 hr tablet Take 50 mg by mouth 2 (two) times daily.   potassium chloride (KLOR-CON) 10 MEQ tablet Take 1 tablet (10 mEq total) by mouth daily.     Allergies:   Lipitor [atorvastatin calcium]   Social History   Socioeconomic History   Marital status: Married    Spouse name: Not on file   Number of children: Not on file   Years of education: Not on file   Highest education level: Not on file  Occupational History   Not on file  Tobacco Use   Smoking status: Every Day   Smokeless tobacco: Never  Vaping Use   Vaping status: Never Used  Substance and Sexual Activity   Alcohol use: No   Drug use: No   Sexual activity: Yes  Other Topics Concern   Not on file  Social History Narrative   Not on file   Social Determinants of Health   Financial Resource Strain: Not on file  Food Insecurity: Not on file  Transportation Needs:  Not on file  Physical Activity: Not on file  Stress: Not on file  Social Connections: Not on file     Family History: The patient's family history includes Diabetes in her brother; Heart disease in her father and mother; Hypertension in her brother, father, and mother. ROS:   Please see the history of present illness.    All 14 point review of systems negative except as described per history of present illness  EKGs/Labs/Other Studies Reviewed:    EKG Interpretation Date/Time:  Monday June 07 2023 09:56:55 EDT Ventricular Rate:  90 PR Interval:  210 QRS Duration:  152 QT Interval:  410 QTC Calculation: 501 R Axis:   119  Text Interpretation: Sinus rhythm with 1st degree A-V  block Right bundle branch block Abnormal ECG When compared with ECG of 23-Aug-2021 16:41, No significant change was found Confirmed by Gypsy Balsam [phone]) on 06/07/2023 10:00:39 AM    Recent Labs: 10/26/2022: Hemoglobin 18.8; Platelets 165  Recent Lipid Panel    Component Value Date/Time   CHOL 142 10/26/2022 1125   TRIG 71 10/26/2022 1125   HDL 31 (L) 10/26/2022 1125   CHOLHDL 4.6 (H) 10/26/2022 1125   LDLCALC 97 10/26/2022 1125    Physical Exam:    VS:  BP (!) 188/88 (BP Location: Left Arm, Patient Position: Sitting)   Pulse 90   Ht 5\' 2"  (1.575 m)   Wt 189 lb 12.8 oz (86.1 kg)   LMP  (LMP Unknown)   SpO2 91%   BMI 34.71 kg/m     Wt Readings from Last 3 Encounters:  06/07/23 189 lb 12.8 oz (86.1 kg)  03/01/23 184 lb (83.5 kg)  10/26/22 199 lb 3.2 oz (90.4 kg)     GEN:  Well nourished, well developed in no acute distress HEENT: Normal NECK: No JVD; No carotid bruits LYMPHATICS: No lymphadenopathy CARDIAC: RRR, no murmurs, no rubs, no gallops RESPIRATORY:  Clear to auscultation without rales, wheezing or rhonchi  ABDOMEN: Soft, non-tender, non-distended MUSCULOSKELETAL:  No edema; No deformity  SKIN: Warm and dry LOWER EXTREMITIES: no swelling NEUROLOGIC:  Alert and oriented x 3 PSYCHIATRIC:  Normal affect   ASSESSMENT:    1. Primary hypertension   2. Aortic stenosis, mild   3. Benign essential hypertension   4. Paroxysmal atrial fibrillation (HCC)   5. Chronic obstructive pulmonary disease, unspecified COPD type (HCC)   6. Smoking    PLAN:    In order of problems listed above:  Essential hypertension uncontrolled.  She did have some blood test done by primary care physician I will try to retrieve the data and see if based on that we can decide what medication to use to help with the blood pressure. Aortic stenosis only mild.  Will continue monitoring. Paroxysmal atrial fibrillation maintained sinus rhythm, continue anticoagulation she is anticoagulated  with Eliquis 5 twice daily.  Dose is appropriate to her weight age and kidney function. COPD quite advanced.  Sadly she still continues to smoke even though she tells me she smoked much less   Medication Adjustments/Labs and Tests Ordered: Current medicines are reviewed at length with the patient today.  Concerns regarding medicines are outlined above.  Orders Placed This Encounter  Procedures   EKG 12-Lead   Medication changes: No orders of the defined types were placed in this encounter.   Signed, Georgeanna Lea, MD, [facility] 06/07/2023 10:13 AM    [facility]

## 2023-06-25 IMAGING — DX DG CHEST 1V PORT
1 series · 1 of 1 positions shown · non-contrast
Comparison: 08/24/2021

CLINICAL DATA: Persistent wheezing and cough

EXAM:
PORTABLE CHEST 1 VIEW

[chest ap]
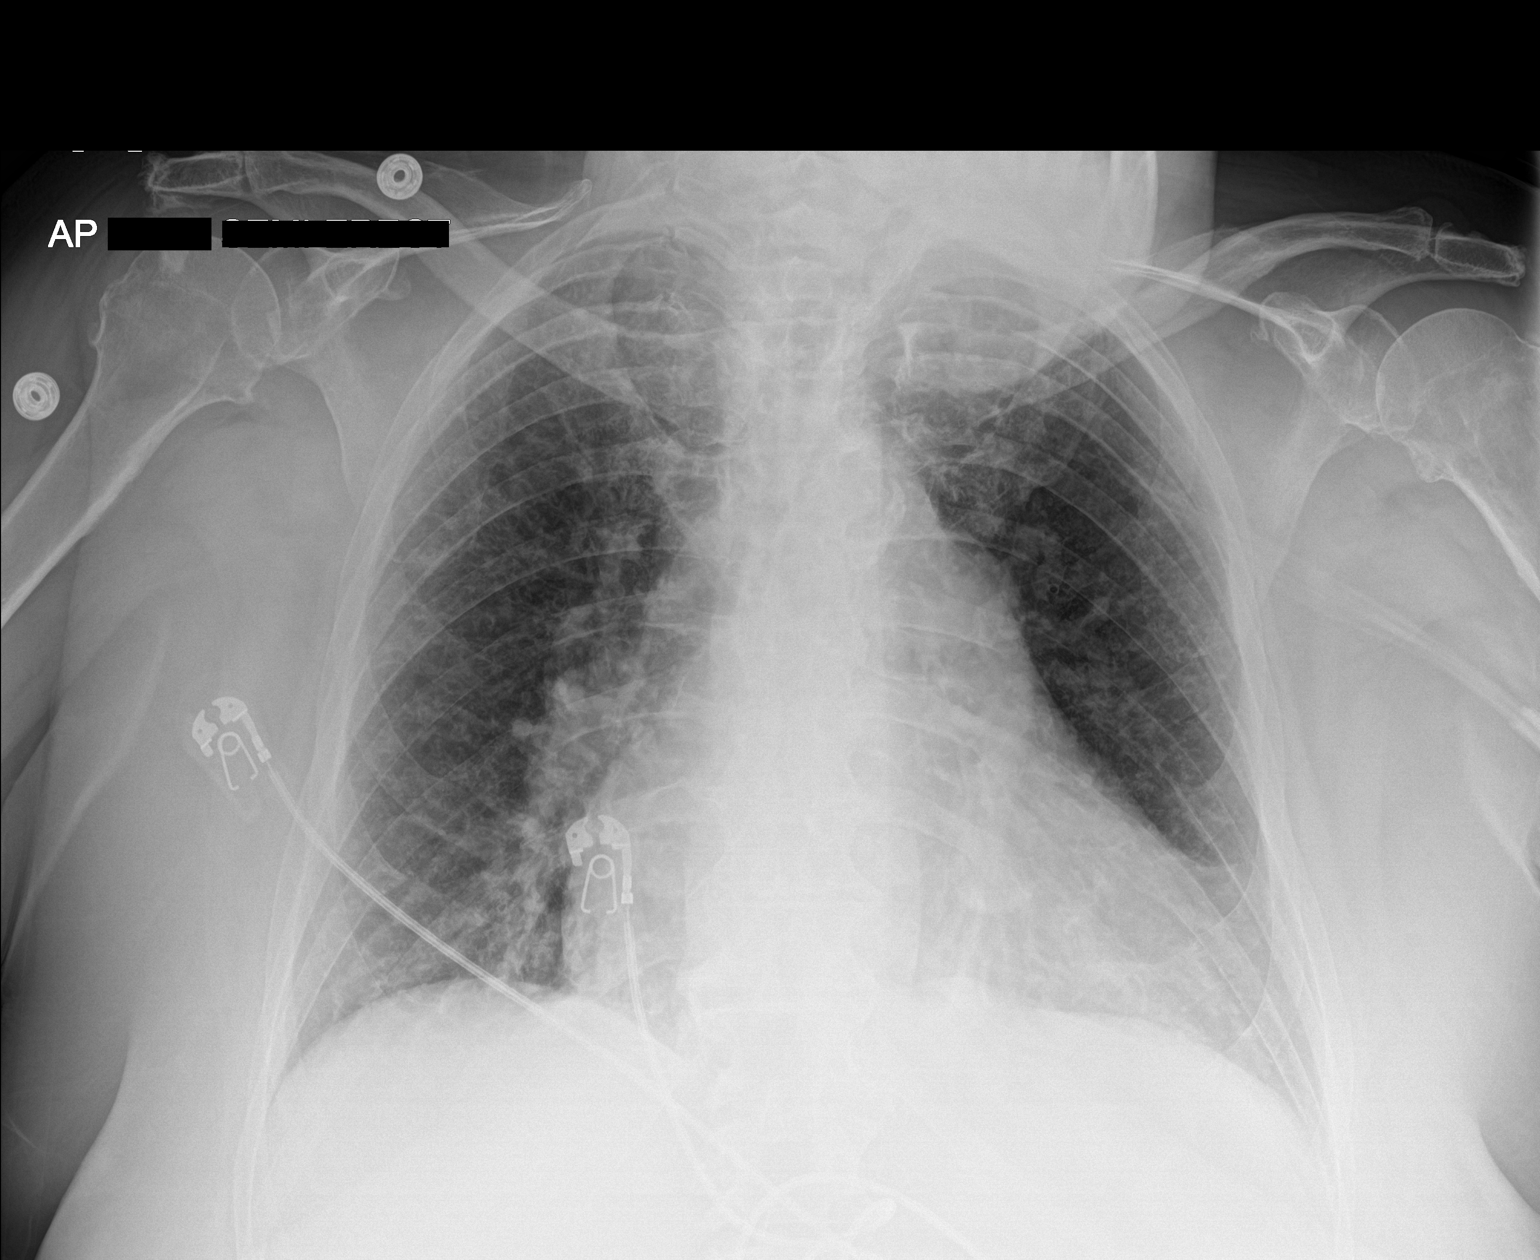

[1 of 1 positions shown; findings below may reference images not displayed]

FINDINGS: Similar mild interstitial prominence. Central pulmonary vascular
congestion. No new consolidation. No pleural effusion. Stable
cardiomediastinal contours with cardiomegaly and enlargement of the
main pulmonary artery suggesting pulmonary arterial hypertension.
IMPRESSION: Persistent cardiomegaly and pulmonary vascular congestion possible
mild interstitial edema. Aeration is improved relative to the prior
study.

## 2023-06-28 DIAGNOSIS — R0600 Dyspnea, unspecified: Secondary | ICD-10-CM | POA: Diagnosis not present

## 2023-06-28 DIAGNOSIS — R051 Acute cough: Secondary | ICD-10-CM | POA: Diagnosis not present

## 2023-08-14 ENCOUNTER — Other Ambulatory Visit: Payer: Self-pay | Admitting: Cardiology

## 2023-08-25 DIAGNOSIS — I10 Essential (primary) hypertension: Secondary | ICD-10-CM | POA: Diagnosis not present

## 2023-09-05 ENCOUNTER — Other Ambulatory Visit: Payer: Self-pay | Admitting: Cardiology

## 2023-09-05 DIAGNOSIS — I480 Paroxysmal atrial fibrillation: Secondary | ICD-10-CM

## 2023-09-06 NOTE — Telephone Encounter (Signed)
Prescription refill request for Eliquis received. Indication:afib Last office visit:9/24 Scr:0.8  9/24 Age: 72 Weight:86.1  kg  Prescription refilled

## 2023-09-11 ENCOUNTER — Other Ambulatory Visit: Payer: Self-pay | Admitting: Cardiology

## 2023-09-25 DIAGNOSIS — I10 Essential (primary) hypertension: Secondary | ICD-10-CM | POA: Diagnosis not present

## 2023-10-12 ENCOUNTER — Telehealth: Payer: Self-pay

## 2023-10-12 NOTE — Telephone Encounter (Signed)
Attempted to call pt regarding message from Dr. Bing Matter to increase Lotrel to 10/20. No answer.

## 2023-10-20 ENCOUNTER — Telehealth: Payer: Self-pay | Admitting: Cardiology

## 2023-10-20 MED ORDER — AMLODIPINE BESY-BENAZEPRIL HCL 10-20 MG PO CAPS: 1.0000 | ORAL_CAPSULE | Freq: Every day | ORAL | 3 refills | Status: DC

## 2023-10-20 NOTE — Telephone Encounter (Signed)
Patient returned RN's call regarding amLODipine-benazepril (LOTREL) 5-20 MG capsule medication.

## 2023-10-20 NOTE — Telephone Encounter (Signed)
Per Dr. Bing Matter. Increase Lotrel to 10/20mg  q day. Pt notified. Medication sent to [facility]

## 2023-10-26 DIAGNOSIS — I10 Essential (primary) hypertension: Secondary | ICD-10-CM | POA: Diagnosis not present

## 2023-11-06 ENCOUNTER — Other Ambulatory Visit: Payer: Self-pay | Admitting: Cardiology

## 2023-11-08 NOTE — Telephone Encounter (Signed)
 Rx refill sent to pharmacy.

## 2023-11-25 DIAGNOSIS — I10 Essential (primary) hypertension: Secondary | ICD-10-CM | POA: Diagnosis not present

## 2023-12-26 DIAGNOSIS — I10 Essential (primary) hypertension: Secondary | ICD-10-CM | POA: Diagnosis not present

## 2024-01-25 DIAGNOSIS — I10 Essential (primary) hypertension: Secondary | ICD-10-CM | POA: Diagnosis not present

## 2024-02-25 DIAGNOSIS — I10 Essential (primary) hypertension: Secondary | ICD-10-CM | POA: Diagnosis not present

## 2024-03-26 ENCOUNTER — Other Ambulatory Visit: Payer: Self-pay | Admitting: Cardiology

## 2024-03-26 DIAGNOSIS — I480 Paroxysmal atrial fibrillation: Secondary | ICD-10-CM

## 2024-03-26 DIAGNOSIS — I10 Essential (primary) hypertension: Secondary | ICD-10-CM | POA: Diagnosis not present

## 2024-03-27 NOTE — Telephone Encounter (Signed)
 Prescription refill request for Eliquis  received. Indication: PAF Last office visit: 06/07/23  JONELLE Fitch MD Scr: 0.8 on 06/14/23  Epic Age: 73 Weight: 86.1kg  Based on above findings Eliquis  5mg  twice daily is the appropriate dose.  Refill approved.

## 2024-04-22 ENCOUNTER — Other Ambulatory Visit: Payer: Self-pay | Admitting: Cardiology

## 2024-04-26 DIAGNOSIS — I10 Essential (primary) hypertension: Secondary | ICD-10-CM | POA: Diagnosis not present

## 2024-05-20 ENCOUNTER — Other Ambulatory Visit: Payer: Self-pay | Admitting: Cardiology

## 2024-05-26 DIAGNOSIS — I10 Essential (primary) hypertension: Secondary | ICD-10-CM | POA: Diagnosis not present

## 2024-06-17 ENCOUNTER — Other Ambulatory Visit: Payer: Self-pay | Admitting: Cardiology

## 2024-06-25 DIAGNOSIS — I10 Essential (primary) hypertension: Secondary | ICD-10-CM | POA: Diagnosis not present

## 2024-06-30 ENCOUNTER — Telehealth: Payer: Self-pay | Admitting: Cardiology

## 2024-06-30 NOTE — Telephone Encounter (Signed)
 Tried to call the patient's home- no answer, just kept ringing.   Called the pharmacy- spoke with Pharmacy Student. The patient checks bp and it goes to the portal- connected to the pharmacy. They spoke w/pt today and she said she was feeling fine. They asked her to re-check her bp, the patient said that her husband is the one who checks it and he will check it when he gets home.    10/3-111/44 10/7- 108/51 10/8- 84/41 10/9- 116/50 10/10- 86/45  Informed that we would notify her provider.   Forwarding this information to the [facility] Triage and to the provider- This patient is seen in [place]

## 2024-06-30 NOTE — Telephone Encounter (Signed)
 Pt c/o BP issue: STAT if pt c/o blurred vision, one-sided weakness or slurred speech.  STAT if BP is GREATER than 180/120 TODAY.  STAT if BP is LESS than 90/60 and SYMPTOMATIC TODAY  1. What is your BP concern? Low BP  2. Have you taken any BP medication today?yes  3. What are your last 5 BP readings? 84/41, 116/50  4. Are you having any other symptoms (ex. Dizziness, headache, blurred vision, passed out)? no

## 2024-07-03 ENCOUNTER — Telehealth: Payer: Self-pay | Admitting: Cardiology

## 2024-07-03 MED ORDER — ATORVASTATIN CALCIUM 10 MG PO TABS: 10.0000 mg | ORAL_TABLET | Freq: Every evening | ORAL | 0 refills | Status: DC

## 2024-07-03 MED ORDER — AMLODIPINE BESY-BENAZEPRIL HCL 10-20 MG PO CAPS: 1.0000 | ORAL_CAPSULE | Freq: Every day | ORAL | 3 refills | Status: AC

## 2024-07-03 MED ORDER — FUROSEMIDE 40 MG PO TABS: 60.0000 mg | ORAL_TABLET | Freq: Every morning | ORAL | 0 refills | Status: DC

## 2024-07-03 NOTE — Telephone Encounter (Signed)
*  STAT* If patient is at the pharmacy, call can be transferred to refill team.   1. Which medications need to be refilled? (please list name of each medication and dose if known)  amLODipine -benazepril  (LOTREL) 10-20 MG capsule   atorvastatin  (LIPITOR) 10 MG tablet   atorvastatin  (LIPITOR) 10 MG tablet   furosemide  (LASIX ) 40 MG tablet   2. Would you like to learn more about the convenience, safety, & potential cost savings by using the [facility]? NO   3. Are you open to using the [facility] (Type [facility]. NO ).   4. Which pharmacy/location (including street and city if local pharmacy) is medication to be sent to? [facility] - [place] - [place] - [address] Phone: [phone]  Fax: [phone]       5. Do they need a 30 day or 90 day supply? Enough to last them until appt on 07/14/24 with Dr. Krasowski

## 2024-07-03 NOTE — Telephone Encounter (Signed)
 Pt's medication was sent to pt's pharmacy as requested. Confirmation received.

## 2024-07-04 ENCOUNTER — Telehealth: Payer: Self-pay | Admitting: Cardiology

## 2024-07-04 MED ORDER — POTASSIUM CHLORIDE ER 10 MEQ PO TBCR: 10.0000 meq | EXTENDED_RELEASE_TABLET | Freq: Every morning | ORAL | 0 refills | Status: DC

## 2024-07-04 MED ORDER — FLECAINIDE ACETATE 50 MG PO TABS: 50.0000 mg | ORAL_TABLET | Freq: Two times a day (BID) | ORAL | 0 refills | Status: DC

## 2024-07-04 NOTE — Telephone Encounter (Signed)
*  STAT* If patient is at the pharmacy, call can be transferred to refill team.   1. Which medications need to be refilled? (please list name of each medication and dose if known) flecainide  (TAMBOCOR ) 50 MG tablet  potassium chloride  (KLOR-CON ) 10 MEQ tablet   2. Would you like to learn more about the convenience, safety, & potential cost savings by using the [facility]? No   3. Are you open to using the [facility] ([facility]. ) No   4. Which pharmacy/location (including street and city if local pharmacy) is medication to be sent to? [facility] - [place], [place] - [place], [place] - [address]    5. Do they need a 30 day or 90 day supply? 30 day  Pharmacy states that pt is out of medication

## 2024-07-04 NOTE — Telephone Encounter (Signed)
 Pt's medications were sent to pt's pharmacy as requested. Confirmation received.

## 2024-07-08 ENCOUNTER — Other Ambulatory Visit: Payer: Self-pay | Admitting: Cardiology

## 2024-07-14 ENCOUNTER — Ambulatory Visit: Attending: Cardiology | Admitting: Cardiology

## 2024-07-14 ENCOUNTER — Encounter: Payer: Self-pay | Admitting: Cardiology

## 2024-07-14 VITALS — BP 120/60 | HR 60 | Ht 62.0 in | Wt 205.0 lb

## 2024-07-14 DIAGNOSIS — R0989 Other specified symptoms and signs involving the circulatory and respiratory systems: Secondary | ICD-10-CM | POA: Diagnosis not present

## 2024-07-14 DIAGNOSIS — I517 Cardiomegaly: Secondary | ICD-10-CM | POA: Diagnosis not present

## 2024-07-14 DIAGNOSIS — R0609 Other forms of dyspnea: Secondary | ICD-10-CM

## 2024-07-14 DIAGNOSIS — I350 Nonrheumatic aortic (valve) stenosis: Secondary | ICD-10-CM | POA: Diagnosis not present

## 2024-07-14 DIAGNOSIS — I10 Essential (primary) hypertension: Secondary | ICD-10-CM | POA: Diagnosis not present

## 2024-07-14 DIAGNOSIS — I480 Paroxysmal atrial fibrillation: Secondary | ICD-10-CM | POA: Diagnosis not present

## 2024-07-14 NOTE — Patient Instructions (Signed)
 Medication Instructions:  Your physician recommends that you continue on your current medications as directed. Please refer to the Current Medication list given to you today.  *If you need a refill on your cardiac medications before your next appointment, please call your pharmacy*   Lab Work: None Ordered If you have labs (blood work) drawn today and your tests are completely normal, you will receive your results only by: MyChart Message (if you have MyChart) OR A paper copy in the mail If you have any lab test that is abnormal or we need to change your treatment, we will call you to review the results.   Testing/Procedures: Your physician has requested that you have an echocardiogram. Echocardiography is a painless test that uses sound waves to create images of your heart. It provides your doctor with information about the size and shape of your heart and how well your heart's chambers and valves are working. This procedure takes approximately one hour. There are no restrictions for this procedure. Please do NOT wear cologne, perfume, aftershave, or lotions (deodorant is allowed). Please arrive 15 minutes prior to your appointment time.  Please note: We ask at that you not bring children with you during ultrasound (echo/ vascular) testing. Due to room size and safety concerns, children are not allowed in the ultrasound rooms during exams. Our front office staff cannot provide observation of children in our lobby area while testing is being conducted. An adult accompanying a patient to their appointment will only be allowed in the ultrasound room at the discretion of the ultrasound technician under special circumstances. We apologize for any inconvenience.   Your physician has requested that you have a carotid duplex. This test is an ultrasound of the carotid arteries in your neck. It looks at blood flow through these arteries that supply the brain with blood. Allow one hour for this exam. There  are no restrictions or special instructions.     Follow-Up: At [facility], you and your health needs are our priority.  As part of our continuing mission to provide you with exceptional heart care, we have created designated Provider Care Teams.  These Care Teams include your primary Cardiologist (physician) and Advanced Practice Providers (APPs -  Physician Assistants and Nurse Practitioners) who all work together to provide you with the care you need, when you need it.  We recommend signing up for the patient portal called "MyChart".  Sign up information is provided on this After Visit Summary.  MyChart is used to connect with patients for Virtual Visits (Telemedicine).  Patients are able to view lab/test results, encounter notes, upcoming appointments, etc.  Non-urgent messages can be sent to your provider as well.   To learn more about what you can do with MyChart, go to [email or web address].    Your next appointment:   6 month(s)  The format for your next appointment:   In Person  Provider:   Gypsy Balsam, MD    Other Instructions NA

## 2024-07-14 NOTE — Addendum Note (Signed)
 Addended by: ARLOA PLANAS D on: 07/14/2024 03:56 PM   Modules accepted: Orders

## 2024-07-14 NOTE — Progress Notes (Signed)
 Cardiology Office Note:    Date:  07/14/2024   ID:  Malky, Rudzinski [birth year], MRN [id number]  PCP:  [name] LABOR, MD  Cardiologist:  Lamar Fitch, MD    Referring MD: [name] LABOR, MD   Chief Complaint  Patient presents with   Follow-up  Doing fine  History of Present Illness:    Makayla Leach is a 73 y.o. female past medical history significant for paroxysmal atrial fibrillation, anticoagulated, on flecainide , essential hypertension, arctic stenosis which was mild to moderate year ago, peripheral vascular disease in form of carotic arterial stenosis.  Sadly she still continues to smoke.  Comes today to my office doing fine described to have some shortness of breath also tells me that she smokes much more than before but still few cigarettes a day.  Shortness of breath with exertion but no swelling of lower extremities no dizziness no passing out no chest pain  Past Medical History:  Diagnosis Date   Aortic stenosis    Aortic stenosis, mild 03/07/2016   Benign essential hypertension 06/24/2015   CHF (congestive heart failure) (HCC)    COPD (chronic obstructive pulmonary disease) (HCC)    Diabetes mellitus without complication (HCC)    Hypertension    LAE (left atrial enlargement)    Left-sided epistaxis 01/14/2018   Paroxysmal atrial fibrillation (HCC)    Paroxysmal atrial flutter (HCC) 06/24/2015   Peripheral vascular disease    Smoking 06/24/2015   Type 2 diabetes mellitus without complication 06/24/2015    Past Surgical History:  Procedure Laterality Date   TONSILLECTOMY     TUBAL LIGATION      Current Medications: Current Meds  Medication Sig   albuterol  (ACCUNEB ) 0.63 MG/3ML nebulizer solution Take 0.63 mg by nebulization every 6 (six) hours as needed for wheezing.   albuterol  (PROVENTIL  HFA;VENTOLIN  HFA) 108 (90 Base) MCG/ACT inhaler Inhale 2 puffs into the lungs every 6 (six) hours as needed for wheezing or shortness of breath.   amLODipine -benazepril   (LOTREL) 10-20 MG capsule Take 1 capsule by mouth daily.   apixaban  (ELIQUIS ) 5 MG TABS tablet TAKE ONE TABLET TWICE DAILY   atorvastatin  (LIPITOR) 10 MG tablet Take 1 tablet (10 mg total) by mouth every evening.   empagliflozin (JARDIANCE) 10 MG TABS tablet Take 10 mg by mouth daily.   flecainide  (TAMBOCOR ) 50 MG tablet Take 1 tablet (50 mg total) by mouth 2 (two) times daily.   furosemide  (LASIX ) 40 MG tablet Take 1.5 tablets (60 mg total) by mouth every morning.   metoprolol  succinate (TOPROL -XL) 50 MG 24 hr tablet Take 50 mg by mouth 2 (two) times daily.   potassium chloride  (KLOR-CON ) 10 MEQ tablet Take 1 tablet (10 mEq total) by mouth every morning.     Allergies:   Lipitor [atorvastatin  calcium ]   Social History   Socioeconomic History   Marital status: Married    Spouse name: Not on file   Number of children: Not on file   Years of education: Not on file   Highest education level: Not on file  Occupational History   Not on file  Tobacco Use   Smoking status: Every Day   Smokeless tobacco: Never  Vaping Use   Vaping status: Never Used  Substance and Sexual Activity   Alcohol use: No   Drug use: No   Sexual activity: Yes  Other Topics Concern   Not on file  Social History Narrative   Not on file   Social Drivers of  Health   Financial Resource Strain: Not on file  Food Insecurity: Not on file  Transportation Needs: Not on file  Physical Activity: Not on file  Stress: Not on file  Social Connections: Not on file     Family History: The patient's family history includes Diabetes in her brother; Heart disease in her father and mother; Hypertension in her brother, father, and mother. ROS:   Please see the history of present illness.    All 14 point review of systems negative except as described per history of present illness  EKGs/Labs/Other Studies Reviewed:    EKG Interpretation Date/Time:  Friday July 14 2024 15:33:09 EDT Ventricular Rate:  60 PR  Interval:  200 QRS Duration:  160 QT Interval:  478 QTC Calculation: 478 R Axis:   135  Text Interpretation: Normal sinus rhythm Right bundle branch block Septal infarct , age undetermined When compared with ECG of 07-Jun-2023 09:56, Vent. rate has decreased BY  30 BPM Septal infarct is now Present Confirmed by Bernie Charleston [phone]) on 07/14/2024 3:39:02 PM    Recent Labs: No results found for requested labs within last 365 days.  Recent Lipid Panel    Component Value Date/Time   CHOL 142 10/26/2022 1125   TRIG 71 10/26/2022 1125   HDL 31 (L) 10/26/2022 1125   CHOLHDL 4.6 (H) 10/26/2022 1125   LDLCALC 97 10/26/2022 1125    Physical Exam:    VS:  BP 120/60   Pulse 60   Ht 5' 2 (1.575 m)   Wt 205 lb (93 kg)   LMP  (LMP Unknown)   SpO2 93%   BMI 37.49 kg/m     Wt Readings from Last 3 Encounters:  07/14/24 205 lb (93 kg)  06/07/23 189 lb 12.8 oz (86.1 kg)  03/01/23 184 lb (83.5 kg)     GEN:  Well nourished, well developed in no acute distress HEENT: Normal NECK: No JVD; No carotid bruits LYMPHATICS: No lymphadenopathy CARDIAC: RRR, systolic ejection murmur grade 2/6 to 3/6 best heard right upper portion of the sternum, no rubs, no gallops RESPIRATORY:  Clear to auscultation without rales, wheezing or rhonchi  ABDOMEN: Soft, non-tender, non-distended MUSCULOSKELETAL:  No edema; No deformity  SKIN: Warm and dry LOWER EXTREMITIES: no swelling NEUROLOGIC:  Alert and oriented x 3 PSYCHIATRIC:  Normal affect   ASSESSMENT:    1. Paroxysmal atrial fibrillation (HCC)   2. Benign essential hypertension   3. LAE (left atrial enlargement)   4. Nonrheumatic aortic valve stenosis    PLAN:    In order of problems listed above:  Paroxysmal atrial fibrillation, maintained sinus rhythm continue present management continue anticoagulation and Tambocor , Benign essential hypertension blood pressure well-controlled continue present management. Aortic stenosis plan to repeat  echocardiogram which we will do. Carotic arterial disease time to repeat carotic ultrasounds which we will do. Smoking still a problem we spent great of time talking about this and I strongly recommend to quit   Medication Adjustments/Labs and Tests Ordered: Current medicines are reviewed at length with the patient today.  Concerns regarding medicines are outlined above.  Orders Placed This Encounter  Procedures   EKG 12-Lead   Medication changes: No orders of the defined types were placed in this encounter.   Signed, [place] DOROTHA Bernie, MD, [facility] 07/14/2024 3:47 PM    [facility]

## 2024-07-17 ENCOUNTER — Encounter: Payer: Self-pay | Admitting: Cardiology

## 2024-07-17 DIAGNOSIS — Z23 Encounter for immunization: Secondary | ICD-10-CM | POA: Diagnosis not present

## 2024-07-17 DIAGNOSIS — I4891 Unspecified atrial fibrillation: Secondary | ICD-10-CM | POA: Diagnosis not present

## 2024-07-17 DIAGNOSIS — Z0000 Encounter for general adult medical examination without abnormal findings: Secondary | ICD-10-CM | POA: Diagnosis not present

## 2024-07-17 DIAGNOSIS — E782 Mixed hyperlipidemia: Secondary | ICD-10-CM | POA: Diagnosis not present

## 2024-07-17 DIAGNOSIS — J449 Chronic obstructive pulmonary disease, unspecified: Secondary | ICD-10-CM | POA: Diagnosis not present

## 2024-07-17 DIAGNOSIS — J9611 Chronic respiratory failure with hypoxia: Secondary | ICD-10-CM | POA: Diagnosis not present

## 2024-07-17 DIAGNOSIS — Z1339 Encounter for screening examination for other mental health and behavioral disorders: Secondary | ICD-10-CM | POA: Diagnosis not present

## 2024-07-17 DIAGNOSIS — I5030 Unspecified diastolic (congestive) heart failure: Secondary | ICD-10-CM | POA: Diagnosis not present

## 2024-07-17 DIAGNOSIS — E1169 Type 2 diabetes mellitus with other specified complication: Secondary | ICD-10-CM | POA: Diagnosis not present

## 2024-07-17 DIAGNOSIS — I10 Essential (primary) hypertension: Secondary | ICD-10-CM | POA: Diagnosis not present

## 2024-07-17 DIAGNOSIS — Z9981 Dependence on supplemental oxygen: Secondary | ICD-10-CM | POA: Diagnosis not present

## 2024-07-17 DIAGNOSIS — F17200 Nicotine dependence, unspecified, uncomplicated: Secondary | ICD-10-CM | POA: Diagnosis not present

## 2024-07-25 ENCOUNTER — Other Ambulatory Visit: Payer: Self-pay | Admitting: Cardiology

## 2024-07-25 DIAGNOSIS — I10 Essential (primary) hypertension: Secondary | ICD-10-CM | POA: Diagnosis not present

## 2024-08-07 DIAGNOSIS — E782 Mixed hyperlipidemia: Secondary | ICD-10-CM | POA: Diagnosis not present

## 2024-08-07 DIAGNOSIS — Z79899 Other long term (current) drug therapy: Secondary | ICD-10-CM | POA: Diagnosis not present

## 2024-08-07 DIAGNOSIS — E1169 Type 2 diabetes mellitus with other specified complication: Secondary | ICD-10-CM | POA: Diagnosis not present

## 2024-08-21 ENCOUNTER — Ambulatory Visit

## 2024-08-21 ENCOUNTER — Ambulatory Visit: Attending: Cardiology

## 2024-08-24 DIAGNOSIS — I10 Essential (primary) hypertension: Secondary | ICD-10-CM | POA: Diagnosis not present

## 2024-09-09 ENCOUNTER — Other Ambulatory Visit: Payer: Self-pay | Admitting: Cardiology

## 2024-09-09 DIAGNOSIS — I480 Paroxysmal atrial fibrillation: Secondary | ICD-10-CM

## 2024-09-11 NOTE — Telephone Encounter (Signed)
 Prescription refill request for Eliquis  received. Indication: PAF Last office visit: 07/14/24  JONELLE Fitch MD Scr: 0.8 on 06/14/23  Epic Age: 73 Weight: 93kg  Based on above findings Eliquis  5mg  twice daily is the appropriate dose.  Past due for labs.  Requested they be done at upcoming visit with MD.  Refill approved.

## 2024-09-22 ENCOUNTER — Inpatient Hospital Stay (HOSPITAL_COMMUNITY)

## 2024-09-22 ENCOUNTER — Emergency Department (HOSPITAL_COMMUNITY)

## 2024-09-22 ENCOUNTER — Encounter (HOSPITAL_COMMUNITY): Payer: Self-pay

## 2024-09-22 ENCOUNTER — Inpatient Hospital Stay (HOSPITAL_COMMUNITY)
Admission: EM | Admit: 2024-09-22 | Discharge: 2024-09-23 | DRG: 291 | Disposition: A | Discharge status: Home / Self Care

## 2024-09-22 DIAGNOSIS — F17210 Nicotine dependence, cigarettes, uncomplicated: Secondary | ICD-10-CM | POA: Diagnosis present

## 2024-09-22 DIAGNOSIS — I5033 Acute on chronic diastolic (congestive) heart failure: Secondary | ICD-10-CM | POA: Diagnosis present

## 2024-09-22 DIAGNOSIS — Z1152 Encounter for screening for COVID-19: Secondary | ICD-10-CM | POA: Diagnosis not present

## 2024-09-22 DIAGNOSIS — Z7984 Long term (current) use of oral hypoglycemic drugs: Secondary | ICD-10-CM | POA: Diagnosis not present

## 2024-09-22 DIAGNOSIS — Z833 Family history of diabetes mellitus: Secondary | ICD-10-CM

## 2024-09-22 DIAGNOSIS — E1151 Type 2 diabetes mellitus with diabetic peripheral angiopathy without gangrene: Secondary | ICD-10-CM | POA: Diagnosis present

## 2024-09-22 DIAGNOSIS — J440 Chronic obstructive pulmonary disease with acute lower respiratory infection: Secondary | ICD-10-CM | POA: Diagnosis present

## 2024-09-22 DIAGNOSIS — E119 Type 2 diabetes mellitus without complications: Secondary | ICD-10-CM | POA: Diagnosis not present

## 2024-09-22 DIAGNOSIS — I10 Essential (primary) hypertension: Secondary | ICD-10-CM | POA: Diagnosis present

## 2024-09-22 DIAGNOSIS — I4892 Unspecified atrial flutter: Secondary | ICD-10-CM | POA: Diagnosis present

## 2024-09-22 DIAGNOSIS — J9621 Acute and chronic respiratory failure with hypoxia: Secondary | ICD-10-CM | POA: Diagnosis present

## 2024-09-22 DIAGNOSIS — J441 Chronic obstructive pulmonary disease with (acute) exacerbation: Secondary | ICD-10-CM | POA: Diagnosis present

## 2024-09-22 DIAGNOSIS — I5031 Acute diastolic (congestive) heart failure: Secondary | ICD-10-CM

## 2024-09-22 DIAGNOSIS — Z79899 Other long term (current) drug therapy: Secondary | ICD-10-CM

## 2024-09-22 DIAGNOSIS — J189 Pneumonia, unspecified organism: Secondary | ICD-10-CM | POA: Diagnosis present

## 2024-09-22 DIAGNOSIS — D72829 Elevated white blood cell count, unspecified: Secondary | ICD-10-CM | POA: Diagnosis not present

## 2024-09-22 DIAGNOSIS — I480 Paroxysmal atrial fibrillation: Secondary | ICD-10-CM | POA: Diagnosis present

## 2024-09-22 DIAGNOSIS — Z8249 Family history of ischemic heart disease and other diseases of the circulatory system: Secondary | ICD-10-CM | POA: Diagnosis not present

## 2024-09-22 DIAGNOSIS — I110 Hypertensive heart disease with heart failure: Principal | ICD-10-CM | POA: Diagnosis present

## 2024-09-22 DIAGNOSIS — E785 Hyperlipidemia, unspecified: Secondary | ICD-10-CM | POA: Diagnosis present

## 2024-09-22 DIAGNOSIS — I350 Nonrheumatic aortic (valve) stenosis: Secondary | ICD-10-CM | POA: Diagnosis present

## 2024-09-22 DIAGNOSIS — J449 Chronic obstructive pulmonary disease, unspecified: Secondary | ICD-10-CM | POA: Diagnosis not present

## 2024-09-22 DIAGNOSIS — Z7901 Long term (current) use of anticoagulants: Secondary | ICD-10-CM

## 2024-09-22 DIAGNOSIS — Z66 Do not resuscitate: Secondary | ICD-10-CM | POA: Diagnosis present

## 2024-09-22 DIAGNOSIS — I509 Heart failure, unspecified: Secondary | ICD-10-CM

## 2024-09-22 DIAGNOSIS — I5032 Chronic diastolic (congestive) heart failure: Secondary | ICD-10-CM | POA: Diagnosis not present

## 2024-09-22 DIAGNOSIS — Z720 Tobacco use: Secondary | ICD-10-CM | POA: Diagnosis not present

## 2024-09-22 DIAGNOSIS — I5030 Unspecified diastolic (congestive) heart failure: Secondary | ICD-10-CM | POA: Insufficient documentation

## 2024-09-22 DIAGNOSIS — F17200 Nicotine dependence, unspecified, uncomplicated: Secondary | ICD-10-CM | POA: Diagnosis present

## 2024-09-22 LAB — RESP PANEL BY RT-PCR (RSV, FLU A&B, COVID)  RVPGX2
Influenza A by PCR: NEGATIVE
Influenza B by PCR: NEGATIVE
Resp Syncytial Virus by PCR: NEGATIVE
SARS Coronavirus 2 by RT PCR: NEGATIVE

## 2024-09-22 LAB — CBC
HCT: 58.3 % — ABNORMAL HIGH (ref 36.0–46.0)
Hemoglobin: 18.7 g/dL — ABNORMAL HIGH (ref 12.0–15.0)
MCH: 29.3 pg (ref 26.0–34.0)
MCHC: 32.1 g/dL (ref 30.0–36.0)
MCV: 91.2 fL (ref 80.0–100.0)
Platelets: 204 K/uL (ref 150–400)
RBC: 6.39 MIL/uL — ABNORMAL HIGH (ref 3.87–5.11)
RDW: 14 % (ref 11.5–15.5)
WBC: 14 K/uL — ABNORMAL HIGH (ref 4.0–10.5)
nRBC: 0 % (ref 0.0–0.2)

## 2024-09-22 LAB — ECHOCARDIOGRAM COMPLETE
AR max vel: 0.93 cm2
AV Area VTI: 0.96 cm2
AV Area mean vel: 0.85 cm2
AV Mean grad: 33 mmHg
AV Peak grad: 54.5 mmHg
Ao pk vel: 3.69 m/s
Calc EF: 60.5 %
S' Lateral: 3.4 cm
Single Plane A2C EF: 60 %
Single Plane A4C EF: 61.8 %

## 2024-09-22 LAB — I-STAT VENOUS BLOOD GAS, ED
Acid-Base Excess: 1 mmol/L (ref 0.0–2.0)
Acid-base deficit: 3 mmol/L — ABNORMAL HIGH (ref 0.0–2.0)
Bicarbonate: 26.1 mmol/L (ref 20.0–28.0)
Bicarbonate: 27.4 mmol/L (ref 20.0–28.0)
Calcium, Ion: 1.16 mmol/L (ref 1.15–1.40)
Calcium, Ion: 1.12 mmol/L — ABNORMAL LOW (ref 1.15–1.40)
HCT: 58 % — ABNORMAL HIGH (ref 36.0–46.0)
HCT: 55 % — ABNORMAL HIGH (ref 36.0–46.0)
Hemoglobin: 19.7 g/dL — ABNORMAL HIGH (ref 12.0–15.0)
Hemoglobin: 18.7 g/dL — ABNORMAL HIGH (ref 12.0–15.0)
O2 Saturation: 94 %
O2 Saturation: 67 %
Potassium: 4.3 mmol/L (ref 3.5–5.1)
Potassium: 4.3 mmol/L (ref 3.5–5.1)
Sodium: 142 mmol/L (ref 135–145)
Sodium: 143 mmol/L (ref 135–145)
TCO2: 28 mmol/L (ref 22–32)
TCO2: 29 mmol/L (ref 22–32)
pCO2, Ven: 59.7 mmHg (ref 44–60)
pCO2, Ven: 48.2 mmHg (ref 44–60)
pH, Ven: 7.248 — ABNORMAL LOW (ref 7.25–7.43)
pH, Ven: 7.363 (ref 7.25–7.43)
pO2, Ven: 85 mmHg — ABNORMAL HIGH (ref 32–45)
pO2, Ven: 37 mmHg (ref 32–45)

## 2024-09-22 LAB — COMPREHENSIVE METABOLIC PANEL WITH GFR
ALT: 22 U/L (ref 0–44)
AST: 27 U/L (ref 15–41)
Albumin: 4.5 g/dL (ref 3.5–5.0)
Alkaline Phosphatase: 104 U/L (ref 38–126)
Anion gap: 13 (ref 5–15)
BUN: 21 mg/dL (ref 8–23)
CO2: 24 mmol/L (ref 22–32)
Calcium: 9.5 mg/dL (ref 8.9–10.3)
Chloride: 103 mmol/L (ref 98–111)
Creatinine, Ser: 0.95 mg/dL (ref 0.44–1.00)
GFR, Estimated: >60 mL/min
Glucose, Bld: 197 mg/dL — ABNORMAL HIGH (ref 70–99)
Potassium: 4.6 mmol/L (ref 3.5–5.1)
Sodium: 140 mmol/L (ref 135–145)
Total Bilirubin: 0.7 mg/dL (ref 0.0–1.2)
Total Protein: 8.7 g/dL — ABNORMAL HIGH (ref 6.5–8.1)

## 2024-09-22 LAB — TROPONIN T, HIGH SENSITIVITY: Troponin T High Sensitivity: <15 ng/L (ref 0–19)

## 2024-09-22 LAB — BLOOD GAS, VENOUS
Acid-Base Excess: 1.4 mmol/L (ref 0.0–2.0)
Bicarbonate: 29 mmol/L — ABNORMAL HIGH (ref 20.0–28.0)
Drawn by: 74794
O2 Saturation: 59.1 %
Patient temperature: 36.4
pCO2, Ven: 54 mmHg (ref 44–60)
pH, Ven: 7.34 (ref 7.25–7.43)
pO2, Ven: 36 mmHg (ref 32–45)

## 2024-09-22 LAB — MAGNESIUM: Magnesium: 2.3 mg/dL (ref 1.7–2.4)

## 2024-09-22 LAB — PRO BRAIN NATRIURETIC PEPTIDE: Pro Brain Natriuretic Peptide: 2124 pg/mL — ABNORMAL HIGH

## 2024-09-22 LAB — TSH: TSH: 0.828 u[IU]/mL (ref 0.350–4.500)

## 2024-09-22 MED ORDER — APIXABAN 5 MG PO TABS
5.0000 mg | ORAL_TABLET | Freq: Two times a day (BID) | ORAL | Status: DC
  Administered 2024-09-22 – 2024-09-23 (×3): 5 mg via ORAL
  Filled 2024-09-22 (×3): qty 1

## 2024-09-22 MED ORDER — SODIUM CHLORIDE 0.9 % IV SOLN: 1.0000 g | Freq: Once | INTRAVENOUS | Status: DC

## 2024-09-22 MED ORDER — SODIUM CHLORIDE 0.9 % IV SOLN
500.0000 mg | Freq: Once | INTRAVENOUS | Status: DC
  Filled 2024-09-22: qty 5

## 2024-09-22 MED ORDER — EMPAGLIFLOZIN 10 MG PO TABS
10.0000 mg | ORAL_TABLET | Freq: Every day | ORAL | Status: DC
  Administered 2024-09-22 – 2024-09-23 (×2): 10 mg via ORAL
  Filled 2024-09-22 (×2): qty 1

## 2024-09-22 MED ORDER — IPRATROPIUM-ALBUTEROL 0.5-2.5 (3) MG/3ML IN SOLN
3.0000 mL | Freq: Once | RESPIRATORY_TRACT | Status: AC
  Administered 2024-09-22: 3 mL via RESPIRATORY_TRACT
  Filled 2024-09-22: qty 3

## 2024-09-22 MED ORDER — SODIUM CHLORIDE 0.9 % IV SOLN
2.0000 g | Freq: Once | INTRAVENOUS | Status: DC
  Administered 2024-09-22: 2 g via INTRAVENOUS
  Filled 2024-09-22: qty 20

## 2024-09-22 MED ORDER — ARFORMOTEROL TARTRATE 15 MCG/2ML IN NEBU
15.0000 ug | INHALATION_SOLUTION | Freq: Two times a day (BID) | RESPIRATORY_TRACT | Status: DC
  Administered 2024-09-22 – 2024-09-23 (×2): 15 ug via RESPIRATORY_TRACT
  Filled 2024-09-22 (×2): qty 2

## 2024-09-22 MED ORDER — METHYLPREDNISOLONE SODIUM SUCC 125 MG IJ SOLR
125.0000 mg | Freq: Once | INTRAMUSCULAR | Status: AC
  Administered 2024-09-22: 125 mg via INTRAVENOUS
  Filled 2024-09-22: qty 2

## 2024-09-22 MED ORDER — FUROSEMIDE 10 MG/ML IJ SOLN
40.0000 mg | Freq: Once | INTRAMUSCULAR | Status: AC
  Administered 2024-09-22: 40 mg via INTRAVENOUS
  Filled 2024-09-22: qty 4

## 2024-09-22 MED ORDER — IPRATROPIUM-ALBUTEROL 0.5-2.5 (3) MG/3ML IN SOLN: 3.0000 mL | RESPIRATORY_TRACT | Status: DC | PRN

## 2024-09-22 MED ORDER — ATORVASTATIN CALCIUM 10 MG PO TABS
10.0000 mg | ORAL_TABLET | Freq: Every evening | ORAL | Status: DC
  Administered 2024-09-22: 10 mg via ORAL
  Filled 2024-09-22: qty 1

## 2024-09-22 MED ORDER — BUDESONIDE 0.25 MG/2ML IN SUSP
0.2500 mg | Freq: Two times a day (BID) | RESPIRATORY_TRACT | Status: DC
  Administered 2024-09-22 – 2024-09-23 (×2): 0.25 mg via RESPIRATORY_TRACT
  Filled 2024-09-22 (×2): qty 2

## 2024-09-22 MED ORDER — FUROSEMIDE 10 MG/ML IJ SOLN
80.0000 mg | Freq: Two times a day (BID) | INTRAMUSCULAR | Status: DC
  Administered 2024-09-22: 80 mg via INTRAVENOUS
  Filled 2024-09-22: qty 8

## 2024-09-22 MED ORDER — AZITHROMYCIN 250 MG PO TABS: 500.0000 mg | ORAL_TABLET | Freq: Once | ORAL | Status: DC

## 2024-09-22 MED ORDER — NICOTINE 7 MG/24HR TD PT24
7.0000 mg | MEDICATED_PATCH | Freq: Every day | TRANSDERMAL | Status: DC
  Administered 2024-09-23: 7 mg via TRANSDERMAL
  Filled 2024-09-22 (×2): qty 1

## 2024-09-22 MED ORDER — FLECAINIDE ACETATE 50 MG PO TABS
50.0000 mg | ORAL_TABLET | Freq: Two times a day (BID) | ORAL | Status: DC
  Administered 2024-09-22 – 2024-09-23 (×3): 50 mg via ORAL
  Filled 2024-09-22 (×3): qty 1

## 2024-09-22 NOTE — ED Provider Notes (Signed)
 " [place] EMERGENCY DEPARTMENT AT [facility] Provider Note   CSN: [id number] Arrival date & time: 09/22/24  1041     Patient presents with: Shortness of Breath and Abdominal Swelling   Makayla Leach is a 74 y.o. female.    Shortness of Breath    Patient has a history of aortic stenosis, hypertension, diabetes, paroxysmal atrial fibrillation, peripheral vascular disease, CHF, COPD, aortic stenosis.  Patient still smokes although she has decreased significantly and only smokes a pack every 2 weeks.  Patient states she feels like she has been retaining fluid.  She does drink a lot of soda.  Her symptoms got worse over the last few days.  Prior to Admission medications  Medication Sig Start Date End Date Taking? Authorizing Provider  albuterol  (ACCUNEB ) 0.63 MG/3ML nebulizer solution Take 0.63 mg by nebulization every 6 (six) hours as needed for wheezing.    [provider]  albuterol  (PROVENTIL  HFA;VENTOLIN  HFA) 108 (90 Base) MCG/ACT inhaler Inhale 2 puffs into the lungs every 6 (six) hours as needed for wheezing or shortness of breath.    [provider]  amLODipine -benazepril  (LOTREL) 10-20 MG capsule Take 1 capsule by mouth daily. 07/03/24   Krasowski, Robert J, MD  atorvastatin  (LIPITOR) 10 MG tablet Take 1 tablet (10 mg total) by mouth every evening. FOR CHOLESTEROL 07/27/24   Bernie Lamar PARAS, MD  ELIQUIS  5 MG TABS tablet TAKE ONE TABLET TWICE DAILY 09/11/24   Krasowski, Robert J, MD  empagliflozin  (JARDIANCE ) 10 MG TABS tablet Take 10 mg by mouth daily.    [provider]  flecainide  (TAMBOCOR ) 50 MG tablet TAKE ONE TABLET BY MOUTH TWICE DAILY 07/27/24   Krasowski, Robert J, MD  furosemide  (LASIX ) 40 MG tablet Take 1.5 tablets (60 mg total) by mouth every morning. 07/27/24   Krasowski, Robert J, MD  metoprolol  succinate (TOPROL -XL) 50 MG 24 hr tablet Take 50 mg by mouth 2 (two) times daily. 08/29/19   [provider]  potassium  chloride (KLOR-CON ) 10 MEQ tablet TAKE ONE TABLET BY MOUTH EVERY MORNING 07/27/24   Krasowski, Robert J, MD    Allergies: Lipitor [atorvastatin  calcium ]    Review of Systems  Respiratory:  Positive for shortness of breath.     Updated Vital Signs BP (!) 187/74 (BP Location: Right Arm)   Pulse 99   Temp 98.6 F (37 C)   Resp (!) 41   LMP  (LMP Unknown)   SpO2 100%   Physical Exam Vitals and nursing note reviewed.  Constitutional:      Appearance: She is well-developed. She is ill-appearing. She is not diaphoretic.  HENT:     Head: Normocephalic and atraumatic.     Right Ear: External ear normal.     Left Ear: External ear normal.  Eyes:     General: No scleral icterus.       Right eye: No discharge.        Left eye: No discharge.     Conjunctiva/sclera: Conjunctivae normal.  Neck:     Trachea: No tracheal deviation.  Cardiovascular:     Rate and Rhythm: Normal rate and regular rhythm.  Pulmonary:     Effort: Accessory muscle usage and respiratory distress present.     Breath sounds: No stridor. Decreased breath sounds present. No wheezing or rales.  Abdominal:     General: Bowel sounds are normal. There is no distension.     Palpations: Abdomen is soft.     Tenderness:  There is no abdominal tenderness. There is no guarding or rebound.  Musculoskeletal:        General: No tenderness or deformity.     Cervical back: Neck supple.     Right lower leg: No edema.     Left lower leg: No edema.  Skin:    General: Skin is warm and dry.     Findings: No rash.  Neurological:     General: No focal deficit present.     Mental Status: She is alert.     Cranial Nerves: No cranial nerve deficit, dysarthria or facial asymmetry.     Sensory: No sensory deficit.     Motor: No abnormal muscle tone or seizure activity.     Coordination: Coordination normal.  Psychiatric:        Mood and Affect: Mood normal.     (all labs ordered are listed, but only abnormal results are  displayed) Labs Reviewed  COMPREHENSIVE METABOLIC PANEL WITH GFR - Abnormal; Notable for the following components:      Result Value   Glucose, Bld 197 (*)    Total Protein 8.7 (*)    All other components within normal limits  CBC - Abnormal; Notable for the following components:   WBC 14.0 (*)    RBC 6.39 (*)    Hemoglobin 18.7 (*)    HCT 58.3 (*)    All other components within normal limits  PRO BRAIN NATRIURETIC PEPTIDE - Abnormal; Notable for the following components:   Pro Brain Natriuretic Peptide 2,124.0 (*)    All other components within normal limits  I-STAT VENOUS BLOOD GAS, ED - Abnormal; Notable for the following components:   pH, Ven 7.248 (*)    pO2, Ven 85 (*)    Acid-base deficit 3.0 (*)    HCT 58.0 (*)    Hemoglobin 19.7 (*)    All other components within normal limits  RESP PANEL BY RT-PCR (RSV, FLU A&B, COVID)  RVPGX2  TROPONIN T, HIGH SENSITIVITY    EKG: EKG Interpretation Date/Time:  Friday September 22 2024 11:33:34 EST Ventricular Rate:  94 PR Interval:  167 QRS Duration:  175 QT Interval:  408 QTC Calculation: 511 R Axis:   136  Text Interpretation: Sinus rhythm Probable left atrial enlargement Right bundle branch block No significant change since last tracing Confirmed by Randol Simmonds [phone]) on 09/22/2024 11:37:01 AM  Radiology: [facility] Chest Port 1 View Result Date: 09/22/2024 EXAM: 1 VIEW(S) XRAY OF THE CHEST 09/22/2024 11:55:17 AM COMPARISON: Chest x-ray 08/25/2021. CLINICAL HISTORY: Dyspnea. FINDINGS: LUNGS AND PLEURA: Diffuse interstitial prominence. Bibasilar patchy airspace and interstitial opacities. Blunting of bilateral costophrenic angles with possible trace pleural effusions. No pneumothorax. HEART AND MEDIASTINUM: Cardiomegaly. Atherosclerotic plaque noted. BONES AND SOFT TISSUES: No acute osseous abnormality. IMPRESSION: 1. Diffuse interstitial prominence and bibasilar patchy airspace and interstitial opacities. 2. Blunting of the bilateral  costophrenic angles with trace pleural effusions. 3. Cardiomegaly. Electronically signed by: Morgane Naveau MD 09/22/2024 12:52 PM EST RP Workstation: [id number]     .Critical Care  Performed by: Randol Simmonds, MD Authorized by: Randol Simmonds, MD   Critical care provider statement:    Critical care time (minutes):  30   Critical care was time spent personally by me on the following activities:  Development of treatment plan with patient or surrogate, discussions with consultants, evaluation of patient's response to treatment, examination of patient, ordering and review of laboratory studies, ordering and review of radiographic studies, ordering and performing treatments and  interventions, pulse oximetry, re-evaluation of patient's condition and review of old charts    Medications Ordered in the ED  cefTRIAXone  (ROCEPHIN ) 1 g in sodium chloride  0.9 % 100 mL IVPB (has no administration in time range)  azithromycin  (ZITHROMAX ) tablet 500 mg (has no administration in time range)  furosemide  (LASIX ) injection 40 mg (has no administration in time range)  methylPREDNISolone  sodium succinate (SOLU-MEDROL ) 125 mg/2 mL injection 125 mg (125 mg Intravenous Given 09/22/24 1122)  ipratropium-albuterol  (DUONEB) 0.5-2.5 (3) MG/3ML nebulizer solution 3 mL (3 mLs Nebulization Given 09/22/24 1135)    Clinical Course as of 09/25/24 1115  Fri Sep 22, 2024  1136 I-Stat venous blood gas, ED(!) Resp acidosis [JK]  1141 Patient noticing some improvement with BiPAP.  Oxygenation is 100%.   [JK]  1235 Pro Brain natriuretic peptide(!) BNP elevated [JK]  1307 Chest x-ray shows findings of diffuse interstitial prominence and bibasilar patchy airspace disease [JK]  1340 Case discussed with IM service regarding admission [JK]    Clinical Course User Index [JK] Randol Simmonds, MD                                 Medical Decision Making Problems Addressed: Acute on chronic congestive heart failure, unspecified heart failure  type [facility]): acute illness or injury that poses a threat to life or bodily functions Community acquired pneumonia, unspecified laterality: acute illness or injury that poses a threat to life or bodily functions COPD exacerbation (HCC): acute illness or injury that poses a threat to life or bodily functions  Amount and/or Complexity of Data Reviewed Labs: ordered. Decision-making details documented in ED Course. Radiology: ordered and independent interpretation performed.  Risk Prescription drug management.   Patient presented to the ED with complaints of acute shortness of breath.  Patient is chronically on home O2 but has noted significant progression in her difficulty breathing.  On arrival patient noted to have significantly increased work of breathing.  She was hypoxic.  Patient was continued on supplemental oxygen and then transition to BiPAP.  Patient's ED workup does show findings concerning for the possibility of CHF as well as pneumonia.  I suspect there is a component of COPD as well.  Patient was treated with breathing treatments and steroids.  She improved significantly with BiPAP.  Chest x-ray suggest the possibility of pneumonia as well as pulmonary edema.  Will give her dose of Lasix .  I will cover with antibiotics.  I will consult the medical service for admission.     Final diagnoses:  COPD exacerbation (HCC)  Community acquired pneumonia, unspecified laterality  Acute on chronic congestive heart failure, unspecified heart failure type [facility])    ED Discharge Orders     None          Randol Simmonds, MD 09/25/24 1115  "

## 2024-09-22 NOTE — Progress Notes (Signed)
" °  Echocardiogram 2D Echocardiogram has been performed.  Augusto Deckman 09/22/2024, 4:52 PM "

## 2024-09-22 NOTE — Progress Notes (Signed)
 Pt transitioned from BiPAP to 4L [place] tolerating fairly well.

## 2024-09-22 NOTE — Progress Notes (Signed)
 Pts sats 86% on 4L [place] with good waveform. RT placed Pt on HFNC 8L and is tolerating well.

## 2024-09-22 NOTE — ED Notes (Signed)
 Patient is much calmer and resting on stretcher. Family at bedside

## 2024-09-22 NOTE — H&P (Signed)
 " Date: 09/22/2024               Patient Name:  Makayla Leach MRN: [id number]  DOB: [birth year] Age / Sex: 74 y.o., female   PCP: [name] LABOR, MD         Medical Service: Internal Medicine Teaching Service         Attending Physician: Dr. Jone Dauphin      First Contact: Remonia Romano, DO}    Second Contact: Dr. Damien Lease, DO         Pager Information: First Contact Pager: [phone]   Second Contact Pager: [phone]   SUBJECTIVE   Chief Complaint: shortness of breath, swelling in abdomen   History of Present Illness: Makayla Leach is a 74 y.o. female with PMH of hypertension, paroxysmal A-fib, peripheral vascular disease, type 2 diabetes mellitus, aortic stenosis, CHF EF 60 to 65% who presents today with shortness of breath that has been going on for the last 3 days. Patient was sleeping and on BiPAP at the time of evaluation so most of the history was taken from the husband. Husband stated that the SOB started gradually and patient was requiring more oxygen use. She is on 2L chronically at home. He states that she also has inhalers at home but rarely uses it. Patient's husband stated that her legs do swell occasionally but today she is at baseline. He stated that her abdominal swelling is usually what brings her in. Per husband, patient has not had paracentesis before. Husband states that usually the reason why patient comes in is due to fluid building up. Patient also usually sleeps in a recliner and husband reports that at baseline, she cannot lay flat. Patient had no chest pain. Husband reported that she was having palpitations as well. She feels better on BiPAP.  No sick contacts.  Husband does state that patient occasionally misses her Eliquis  dose but is not missed any recently.  No nausea or vomiting reported.  ED Course: On presentation: SpO2 of 70 on 3 L oxygen, blood pressure 187/74, respiratory rate 34, temperature 98.6, 94 pulse  Labs significant for  Respiratory panel  negative WBC 14 Hemoglobin 18.7, baseline of 17 proBNP 2124 Troponin less than 15 VBG shows pH of 7.248, with PO85 and Bicarb of 26.1  Imaging CXR: diffuse interstitial prominence and bibasilar patchy airspace and interstitial opacities, pleural effusions bilaterally and cardiomegaly  Received methylprednisolone IV, DuoNebs, BiPAP, ceftriaxone and azithromycin, furosemide  40 Consulted IMTS  Past Medical History HTN Paroxysmal A-fib Peripheral vascular disease Type 2 diabetes mellitus Aortic stenosis CHF EF 60 to 65%  Past Surgical History Past Surgical History:  Procedure Laterality Date   TONSILLECTOMY     TUBAL LIGATION      Meds:  Active Medications[1] Amlodipine - beneprazil 10-20 mg Daily Atorvastatin  10 mg Daily Eliquis  5mg  BID Flecanide 50 mg BID Furosemide  40 mg 1.5 tablets daily (60 mg daily) Jardiance 10 mg daily Metoprolol  Succinate 50 mg BID Potassium 10 meq daily  Social:  Lives With:Home with husband and puppy 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   OBJECTIVE:   Physical Exam: Blood pressure (!) 115/52, pulse 62, temperature 98.6 F (37 C), resp. rate (!) 21, SpO2 99%.  Constitutional: well-appearing female sitting in bed with bipap on , in no acute distress HENT: normocephalic atraumatic, mucous membranes moist Eyes: conjunctiva non-erythematous Neck: supple, JVP +   Cardiovascular: regular rate and rhythm, left upper sternal border systolic murmur  Pulmonary/Chest: normal work of breathing on bipap, crackles heard bibasilar but not wheezing appreciated  Abdominal: soft, non-tender, non-distended, protuberant with edema MSK: normal bulk and tone Neurological: lethargic and unable to speak much due to bipap but is able to answer questions appropriately  Skin: warm and dry Psych: sleepy   Labs: CBC    Component Value Date/Time   WBC 14.0 (H) 09/22/2024 1104   RBC 6.39 (H) 09/22/2024 1104   HGB 19.7 (H) 09/22/2024 1126   HGB 18.8 (H) 10/26/2022 1125   HCT 58.0 (H) 09/22/2024 1126   HCT 56.8 (H) 10/26/2022 1125   PLT 204 09/22/2024 1104   PLT 165 10/26/2022 1125   MCV 91.2 09/22/2024 1104   MCV 92 10/26/2022 1125   MCH 29.3 09/22/2024 1104   MCHC 32.1 09/22/2024 1104   RDW 14.0 09/22/2024 1104   RDW 13.1 10/26/2022 1125   LYMPHSABS 2.0 08/23/2021 1845   MONOABS 0.9 08/23/2021 1845   EOSABS 0.0 08/23/2021 1845   BASOSABS 0.0 08/23/2021 1845     CMP     Component Value Date/Time   NA 142 09/22/2024 1126   NA 143 09/08/2021 1436   K 4.3 09/22/2024 1126   CL 103 09/22/2024 1104   CO2 24 09/22/2024 1104   GLUCOSE 197 (H) 09/22/2024 1104   BUN 21 09/22/2024 1104   BUN 14 09/08/2021 1436   CREATININE 0.95 09/22/2024 1104   CALCIUM  9.5 09/22/2024 1104   PROT 8.7 (H) 09/22/2024 1104   PROT 6.6 11/06/2019 1129   ALBUMIN 4.5 09/22/2024 1104   ALBUMIN 3.8 11/06/2019 1129   AST 27 09/22/2024 1104   ALT 22 09/22/2024 1104   ALKPHOS 104 09/22/2024 1104   BILITOT 0.7 09/22/2024 1104   BILITOT 0.5 11/06/2019 1129   GFRNONAA >60 09/22/2024 1104   GFRAA 105 04/15/2020 1408    Imaging:  ECHOCARDIOGRAM COMPLETE Result Date: 09/22/2024    ECHOCARDIOGRAM REPORT   Patient Name:   Makayla Leach Date of Exam: 09/22/2024 Medical Rec #:  [id number]       Height:       62.0 in Accession #:    [id number]      Weight:       205.0 lb Date of Birth:   [birth year]       BSA:          1.932 m Patient Age:    73 years        BP:           112/42 mmHg Patient Gender: F               HR:           85 bpm. Exam Location:  Inpatient Procedure: 2D Echo (Both Spectral and Color Flow Doppler were utilized during            procedure). Indications:    acute  diastolic congestive heart failure  History:        Patient has prior history of Echocardiogram examinations, most                 recent 03/29/2023. COPD, Aortic Valve Disease and Mitral Valve                 Disease; Risk Factors:Hypertension, Current Smoker and Diabetes.                 Aortic Valve: valve is present in the aortic position.  Sonographer:    Tinnie Barefoot RDCS Referring Phys: [id number] PHOEBE CHUN IMPRESSIONS  1. Left ventricular ejection fraction, by estimation, is 60 to 65%. The left ventricle has normal function. The left ventricle has no regional wall motion abnormalities. There is mild concentric left ventricular hypertrophy. Left ventricular diastolic parameters are consistent with Grade I diastolic dysfunction (impaired relaxation).  2. Right ventricular systolic function is mildly reduced. The right ventricular size is normal.  3. Left atrial size was mildly dilated.  4. The mitral valve is normal in structure. No evidence of mitral valve regurgitation. No evidence of mitral stenosis. The mean mitral valve gradient is 13.0 mmHg. Severe mitral annular calcification.  5. The aortic valve is tricuspid. Aortic valve regurgitation is mild. Moderate aortic valve stenosis. There is a valve present in the aortic position. Aortic valve area, by VTI measures 0.96 cm. Aortic valve mean gradient measures 33.0 mmHg. Aortic valve Vmax measures 3.69 m/s.  6. The inferior vena cava is normal in size with greater than 50% respiratory variability, suggesting right atrial pressure of 3 mmHg. FINDINGS  Left Ventricle: Left ventricular ejection fraction, by estimation, is 60 to 65%. The left ventricle has normal  function. The left ventricle has no regional wall motion abnormalities. The left ventricular internal cavity size was normal in size. There is  mild concentric left ventricular hypertrophy. Left ventricular diastolic parameters are consistent with Grade I diastolic dysfunction (impaired relaxation). Right Ventricle: The right ventricular size is normal. No increase in right ventricular wall thickness. Right ventricular systolic function is mildly reduced. Left Atrium: Left atrial size was mildly dilated. Right Atrium: Right atrial size was normal in size. Pericardium: There is no evidence of pericardial effusion. Mitral Valve: Elevated gradient more due to severe MAC versus true MS. MVA 2.3. The mitral valve is normal in structure. Severe mitral annular calcification. No evidence of mitral valve regurgitation. No evidence of mitral valve stenosis. MV peak gradient, 24.0 mmHg. The mean mitral valve gradient is 13.0 mmHg. Tricuspid Valve: The tricuspid valve is normal in structure. Tricuspid valve regurgitation is trivial. No evidence of tricuspid stenosis. Aortic Valve: DVI 0.3. Significant progression since previous echo. The aortic valve is tricuspid. Aortic valve regurgitation is mild. Moderate aortic stenosis is present. Aortic valve mean gradient measures 33.0 mmHg. Aortic valve peak gradient measures  54.5 mmHg. Aortic valve area, by VTI measures 0.96 cm. There is a valve present in the aortic position. Pulmonic Valve: The pulmonic valve was normal in structure. Pulmonic valve regurgitation is not visualized. No evidence of pulmonic stenosis. Aorta: The aortic root is normal in size and structure. Venous: The inferior vena cava is normal in size with greater than 50% respiratory variability, suggesting right atrial pressure of 3 mmHg. IAS/Shunts: No atrial level shunt detected by color flow Doppler.  LEFT VENTRICLE PLAX 2D LVIDd:         5.00 cm     Diastology LVIDs:  3.40 cm     LV e' medial:  6.09  cm/s LV PW:         1.10 cm     LV e' lateral: 6.96 cm/s LV IVS:        1.10 cm LVOT diam:     2.00 cm LV SV:         71 LV SV Index:   37 LVOT Area:     3.14 cm  LV Volumes (MOD) LV vol d, MOD A2C: 49.7 ml LV vol d, MOD A4C: 48.9 ml LV vol s, MOD A2C: 19.9 ml LV vol s, MOD A4C: 18.7 ml LV SV MOD A2C:     29.8 ml LV SV MOD A4C:     48.9 ml LV SV MOD BP:      29.6 ml RIGHT VENTRICLE             IVC RV Basal diam:  2.30 cm     IVC diam: 2.10 cm RV S prime:     10.00 cm/s TAPSE (M-mode): 2.0 cm LEFT ATRIUM             Index        RIGHT ATRIUM           Index LA diam:        4.80 cm 2.48 cm/m   RA Area:     13.00 cm LA Vol (A2C):   63.9 ml 33.08 ml/m  RA Volume:   28.40 ml  14.70 ml/m LA Vol (A4C):   62.4 ml 32.30 ml/m LA Biplane Vol: 68.0 ml 35.20 ml/m  AORTIC VALVE AV Area (Vmax):    0.93 cm AV Area (Vmean):   0.85 cm AV Area (VTI):     0.96 cm AV Vmax:           369.00 cm/s AV Vmean:          278.000 cm/s AV VTI:            0.733 m AV Peak Grad:      54.5 mmHg AV Mean Grad:      33.0 mmHg LVOT Vmax:         109.50 cm/s LVOT Vmean:        74.900 cm/s LVOT VTI:          0.225 m LVOT/AV VTI ratio: 0.31  AORTA Ao Root diam: 2.50 cm Ao Asc diam:  2.70 cm MITRAL VALVE MV Peak grad: 24.0 mmHg  SHUNTS MV Mean grad: 13.0 mmHg  Systemic VTI:  0.22 m MV Vmax:      2.45 m/s   Systemic Diam: 2.00 cm MV Vmean:     172.5 cm/s Morene Brownie Electronically signed by Morene Brownie Signature Date/Time: 09/22/2024/5:20:31 PM    Final    DG Chest Port 1 View Result Date: 09/22/2024 EXAM: 1 VIEW(S) XRAY OF THE CHEST 09/22/2024 11:55:17 AM COMPARISON: Chest x-ray 08/25/2021. CLINICAL HISTORY: Dyspnea. FINDINGS: LUNGS AND PLEURA: Diffuse interstitial prominence. Bibasilar patchy airspace and interstitial opacities. Blunting of bilateral costophrenic angles with possible trace pleural effusions. No pneumothorax. HEART AND MEDIASTINUM: Cardiomegaly. Atherosclerotic plaque noted. BONES AND SOFT TISSUES: No acute osseous  abnormality. IMPRESSION: 1. Diffuse interstitial prominence and bibasilar patchy airspace and interstitial opacities. 2. Blunting of the bilateral costophrenic angles with trace pleural effusions. 3. Cardiomegaly. Electronically signed by: Morgane Naveau MD 09/22/2024 12:52 PM EST RP Workstation: [id number]     EKG: personally reviewed my interpretation is right bundle branch block  but in  sinus rhythm . Prior EKG shows RBBB but HR was slower and T wave abnormalities less prominent   ASSESSMENT & PLAN:   Assessment & Plan by Problem: Principal Problem:   Acute on chronic hypoxic respiratory failure (HCC) Active Problems:   Aortic stenosis, mild   Benign essential hypertension   Paroxysmal atrial flutter (HCC)   Tobacco use disorder   Diabetes mellitus without complication (HCC)   Aortic stenosis   Leukocytosis   (HFpEF) heart failure with preserved ejection fraction (HCC)   COPD (chronic obstructive pulmonary disease) (HCC)  BENEDETTA SUNDSTROM is a 74 y.o. female with PMH of hypertension, paroxysmal A-fib, peripheral vascular disease, type 2 diabetes mellitus, aortic stenosis, CHF EF 60 to 65% who presents today with shortness of breath that has been going on for the last 3 days and is being admitted for acute on chronic hypoxic respiratory failure.    #Acute on chronic hypoxic respiratory failure  Patient is baseline on oxygen at 2L but came in on 3L at 70%. Patient was up titrated on nasal cannula but was then placed on to bipap for which she was on when we saw her. She was not having respiratory distress at that time. Husband states that this has come on gradually and patient was using more oxygen at home. He states her previous admissions have been due to volume overload as well. With no chest pain and gradual onset, lower differential. BNP is elevated at 2124 and patient does have bibasilar crackles and edema in her abdomen. There were some diffuse interstitial prominence and bibasilar  patchy air space and interstitial opacities but with lack of sick symptoms, do not believe that this is the cause of acute respiratory.  - lasix  40 given, will monitor output and adjust dosing, will give IV 80 mg once  - incentive spirometer and flutter valve - oxygen saturation goal between 88-92%, wean as tolerated - diet once patient is off of BiPAP - duonebs q4 pRN  -d/c abx at this time with low concern for infection  -Updated echo today shows EF 60 to 65%, grade 1 diastolic dysfunction, LV with mild aortic valve regurgitation and now moderate aortic valve stenosis. IVC not dilated  -strict I&Os, daily weights  #CHF EF 60 to 65% #elevated BNP  Last was in 2024 with EF 60 to 65% with Grade 1 diastolic dysfunction. Moderate LVH. LA moderately dilated. Aortic valve regurgitation, moderate and mild aortic valve stenosis.  - new findings as above - will try treating as heart failure exacerbation  - restarted jardiance   #COPD No cough or productive sputum. No formal PFTs noted and patient is not taking any inhalers consistently.  Low concern for exacerbation although on the differential due to hypoxia.   #tobacco use disorder - nicotine 7 patch daily. Smokes a pack a week currently but has smoking since 55-59 years old   #Paroxysmal atrial flutter Rate controlled at this time, do not seen on EKG  - continue apixaban  5 mg  - continue on flecainide  50 mg BID   #aortic stenosis No acute concern. Heard on exam  #leukocytosis  Could be reactive. Has been noted in the past chronically. Has not endorsed any sick symptoms. - will get procal to guide antibiotic therapy   #HLD  Atorvastatin  10 mg   #Type 2 DM  Not on anything at home.  - will get repeat A1c  - watch fasting sugars   Best practice: Diet: NPO until off of bipap  VTE:  DOAC IVF: none  Code: DNR/DNI  Disposition planning: Prior to Admission Living Arrangement: Home, living with husband Anticipated Discharge  Location: pending  Barriers to Discharge: clinical improvement   Dispo: Admit patient to Inpatient with expected length of stay greater than 2 midnights.  Signed: D'Mello, Jrue Yambao, DO Internal Medicine Resident  09/22/2024, 5:54 PM  Please contact IM Residency On-Call Pager at: [phone] or [phone].      [1]  Current Meds  Medication Sig   albuterol  (ACCUNEB ) 0.63 MG/3ML nebulizer solution Take 0.63 mg by nebulization every 6 (six) hours as needed for wheezing.   albuterol  (PROVENTIL  HFA;VENTOLIN  HFA) 108 (90 Base) MCG/ACT inhaler Inhale 2 puffs into the lungs every 6 (six) hours as needed for wheezing or shortness of breath.   amLODipine -benazepril  (LOTREL) 10-20 MG capsule Take 1 capsule by mouth daily.   atorvastatin  (LIPITOR) 10 MG tablet Take 1 tablet (10 mg total) by mouth every evening. FOR CHOLESTEROL   ELIQUIS  5 MG TABS tablet TAKE ONE TABLET TWICE DAILY   empagliflozin (JARDIANCE) 10 MG TABS tablet Take 10 mg by mouth daily.   flecainide  (TAMBOCOR ) 50 MG tablet TAKE ONE TABLET BY MOUTH TWICE DAILY   furosemide  (LASIX ) 40 MG tablet Take 1.5 tablets (60 mg total) by mouth every morning.   metoprolol  succinate (TOPROL -XL) 50 MG 24 hr tablet Take 50 mg by mouth 2 (two) times daily.   potassium chloride  (KLOR-CON ) 10 MEQ tablet TAKE ONE TABLET BY MOUTH EVERY MORNING   "

## 2024-09-22 NOTE — ED Notes (Signed)
 CCMD called.

## 2024-09-22 NOTE — ED Triage Notes (Signed)
 Pt c/o increasing SOB and swelling in abdomen x2-3 days.  Hx of CHF and COPD.  Pt is a smoker.    Pt's O2 was in the 70s on her normal 2L [place].

## 2024-09-22 NOTE — Hospital Course (Addendum)
#  Acute on chronic hypoxic respiratory failure  Patient is baseline on oxygen at 2L but came in on 3L at 70%.  Gradual over the last 3 days.  BNP elevated and bibasilar crackles and edema in her abdomen believe this is likely due to HFpEF exacerbation.  Was given IV diuresis and wean down on oxygen to goal of 88 to 92%.  Also given Pulmicort  and Brovana  nebs scheduled.  Updated echo was unchanged except now moderate aortic valve stenosis.   #CHF EF 60 to 65% #elevated BNP Echo ordered at this visit and showed moderate aortic valve stenosis.  Otherwise unchanged.  Was treated as heart failure exacerbation in the hospital.  Continue Jardiance   #tobacco use disorder -nicotine  7 patch daily. Smokes a pack a week currently but has smoking since [age] years old    #Paroxysmal atrial flutter Rate controlled at this time, NSR on EKG. today on flecainide  and apixaban    #aortic stenosis Moderate aortic valve stenosis.  No acute concern for hospitalization will need follow-up with cardiology   #leukocytosis  Stable.  Could be reactive.  Patient also received IV steroids.  Pro-Cal was 0.27 but given clinical picture and response improvement diuresis still held on antibiotics.   #HLD  Atorvastatin  10 mg    #Type 2 DM  6.8. No SSI at this time

## 2024-09-23 ENCOUNTER — Telehealth: Payer: Self-pay

## 2024-09-23 DIAGNOSIS — I4892 Unspecified atrial flutter: Secondary | ICD-10-CM

## 2024-09-23 DIAGNOSIS — Z720 Tobacco use: Secondary | ICD-10-CM | POA: Diagnosis not present

## 2024-09-23 DIAGNOSIS — E785 Hyperlipidemia, unspecified: Secondary | ICD-10-CM | POA: Diagnosis not present

## 2024-09-23 DIAGNOSIS — I350 Nonrheumatic aortic (valve) stenosis: Secondary | ICD-10-CM

## 2024-09-23 DIAGNOSIS — D72829 Elevated white blood cell count, unspecified: Secondary | ICD-10-CM

## 2024-09-23 DIAGNOSIS — E119 Type 2 diabetes mellitus without complications: Secondary | ICD-10-CM

## 2024-09-23 DIAGNOSIS — J449 Chronic obstructive pulmonary disease, unspecified: Secondary | ICD-10-CM | POA: Diagnosis not present

## 2024-09-23 DIAGNOSIS — J9621 Acute and chronic respiratory failure with hypoxia: Secondary | ICD-10-CM | POA: Diagnosis not present

## 2024-09-23 DIAGNOSIS — I5032 Chronic diastolic (congestive) heart failure: Secondary | ICD-10-CM

## 2024-09-23 LAB — CBC
HCT: 52.4 % — ABNORMAL HIGH (ref 36.0–46.0)
Hemoglobin: 17 g/dL — ABNORMAL HIGH (ref 12.0–15.0)
MCH: 29.7 pg (ref 26.0–34.0)
MCHC: 32.4 g/dL (ref 30.0–36.0)
MCV: 91.6 fL (ref 80.0–100.0)
Platelets: 203 K/uL (ref 150–400)
RBC: 5.72 MIL/uL — ABNORMAL HIGH (ref 3.87–5.11)
RDW: 13.9 % (ref 11.5–15.5)
WBC: 14.9 K/uL — ABNORMAL HIGH (ref 4.0–10.5)
nRBC: 0 % (ref 0.0–0.2)

## 2024-09-23 LAB — BASIC METABOLIC PANEL WITH GFR
Anion gap: 12 (ref 5–15)
BUN: 30 mg/dL — ABNORMAL HIGH (ref 8–23)
CO2: 24 mmol/L (ref 22–32)
Calcium: 9.1 mg/dL (ref 8.9–10.3)
Chloride: 103 mmol/L (ref 98–111)
Creatinine, Ser: 1.07 mg/dL — ABNORMAL HIGH (ref 0.44–1.00)
GFR, Estimated: 55 mL/min — ABNORMAL LOW
Glucose, Bld: 185 mg/dL — ABNORMAL HIGH (ref 70–99)
Potassium: 4.4 mmol/L (ref 3.5–5.1)
Sodium: 139 mmol/L (ref 135–145)

## 2024-09-23 LAB — PROCALCITONIN: Procalcitonin: 0.27 ng/mL

## 2024-09-23 LAB — HEMOGLOBIN A1C
Hgb A1c MFr Bld: 6.8 % — ABNORMAL HIGH (ref 4.8–5.6)
Mean Plasma Glucose: 148.46 mg/dL

## 2024-09-23 LAB — MAGNESIUM: Magnesium: 2.4 mg/dL (ref 1.7–2.4)

## 2024-09-23 MED ORDER — FUROSEMIDE 80 MG PO TABS: 80.0000 mg | ORAL_TABLET | Freq: Every day | ORAL | 0 refills | Status: AC

## 2024-09-23 MED ORDER — NICOTINE 7 MG/24HR TD PT24: 7.0000 mg | MEDICATED_PATCH | Freq: Every day | TRANSDERMAL | 0 refills | Status: AC

## 2024-09-23 MED ORDER — FUROSEMIDE 80 MG PO TABS: 80.0000 mg | ORAL_TABLET | Freq: Every day | ORAL | 0 refills | Status: DC | PRN

## 2024-09-23 MED ORDER — FUROSEMIDE 10 MG/ML IJ SOLN
80.0000 mg | Freq: Two times a day (BID) | INTRAMUSCULAR | Status: DC
  Administered 2024-09-23: 80 mg via INTRAVENOUS
  Filled 2024-09-23: qty 8

## 2024-09-23 MED ORDER — AZITHROMYCIN 250 MG PO TABS: 500.0000 mg | ORAL_TABLET | Freq: Every day | ORAL | Status: DC

## 2024-09-23 MED ORDER — FUROSEMIDE 10 MG/ML IJ SOLN: 80.0000 mg | Freq: Once | INTRAMUSCULAR | Status: DC

## 2024-09-23 MED ORDER — SODIUM CHLORIDE 0.9 % IV SOLN: 1.0000 g | INTRAVENOUS | Status: DC

## 2024-09-23 MED ORDER — TIOTROPIUM BROMIDE 18 MCG IN CAPS: 18.0000 ug | ORAL_CAPSULE | Freq: Every day | RESPIRATORY_TRACT | 0 refills | Status: AC

## 2024-09-23 MED ORDER — FUROSEMIDE 80 MG PO TABS: 80.0000 mg | ORAL_TABLET | Freq: Two times a day (BID) | ORAL | 0 refills | Status: DC

## 2024-09-23 NOTE — Progress Notes (Signed)
 "  HD#1 SUBJECTIVE:  Patient Summary: Makayla Leach is a 74 y.o. female with PMH of hypertension, paroxysmal A-fib, peripheral vascular disease, type 2 diabetes mellitus, aortic stenosis, CHF EF 60 to 65% who presents today with shortness of breath that has been going on for the last 3 days and is being admitted for acute on chronic hypoxic respiratory failure and is being treated for acute on chronic heart failure exacerbation.   Overnight Events: No overnight events   Interim History: Patient states that her breathing is a lot better and she is eating her breakfast at the time of evaluation.   OBJECTIVE:  Vital Signs: Vitals:   09/23/24 0500 09/23/24 0530 09/23/24 0630 09/23/24 0740  BP: 135/64 (!) 135/57 (!) 134/58 120/69  Pulse: 69 68 64 65  Resp: (!) 24 19 17 16   Temp:    (!) 97.5 F (36.4 C)  TempSrc:    Oral  SpO2: 96% 97% 98% 100%   Supplemental O2: Nasal Cannula SpO2: 100 % O2 Flow Rate (L/min): 8 L/min FiO2 (%): 100 %  There were no vitals filed for this visit.   Intake/Output Summary (Last 24 hours) at 09/23/2024 1131 Last data filed at 09/22/2024 2321 Gross per 24 hour  Intake --  Output 2150 ml  Net -2150 ml   Net IO Since Admission: -2,150 mL [09/23/24 1131]  Physical Exam: Physical Exam Constitutional:      General: She is not in acute distress. Cardiovascular:     Heart sounds: Murmur heard.  Pulmonary:     Effort: Pulmonary effort is normal. No respiratory distress.     Comments: Crackles in bilateral lung bases  Abdominal:     Tenderness: There is no abdominal tenderness.     Comments: Edematous abdomen, fluid wave appreciated   Musculoskeletal:     Comments: Mild edema noted bilaterally but more wrinkles on skin indicating some diuresis   Skin:    General: Skin is warm.  Neurological:     Mental Status: She is alert and oriented to person, place, and time.     Patient Lines/Drains/Airways Status     Active Line/Drains/Airways     Name  Placement date Placement time Site Days   Peripheral IV 09/22/24 20 G Right Antecubital 09/22/24  1118  Antecubital  1   External Urinary Catheter 09/22/24  1538  --  1            Pertinent labs and imaging:      Latest Ref Rng & Units 09/23/2024    4:18 AM 09/22/2024    6:03 PM 09/22/2024   11:26 AM  CBC  WBC 4.0 - 10.5 K/uL 14.9     Hemoglobin 12.0 - 15.0 g/dL 82.9  81.2  80.2   Hematocrit 36.0 - 46.0 % 52.4  55.0  58.0   Platelets 150 - 400 K/uL 203          Latest Ref Rng & Units 09/23/2024    4:18 AM 09/22/2024    6:03 PM 09/22/2024   11:26 AM  CMP  Glucose 70 - 99 mg/dL 814     BUN 8 - 23 mg/dL 30     Creatinine 9.55 - 1.00 mg/dL 8.92     Sodium 864 - 854 mmol/L 139  143  142   Potassium 3.5 - 5.1 mmol/L 4.4  4.3  4.3   Chloride 98 - 111 mmol/L 103     CO2 22 - 32 mmol/L 24  Calcium  8.9 - 10.3 mg/dL 9.1       ECHOCARDIOGRAM COMPLETE Result Date: 09/22/2024    ECHOCARDIOGRAM REPORT   Patient Name:   COLANDRA OHANIAN Date of Exam: 09/22/2024 Medical Rec #:  [id number]       Height:       62.0 in Accession #:    [id number]      Weight:       205.0 lb Date of Birth:  January 11, 1951       BSA:          1.932 m Patient Age:    73 years        BP:           112/42 mmHg Patient Gender: F               HR:           85 bpm. Exam Location:  Inpatient Procedure: 2D Echo (Both Spectral and Color Flow Doppler were utilized during            procedure). Indications:    acute diastolic congestive heart failure  History:        Patient has prior history of Echocardiogram examinations, most                 recent 03/29/2023. COPD, Aortic Valve Disease and Mitral Valve                 Disease; Risk Factors:Hypertension, Current Smoker and Diabetes.                 Aortic Valve: valve is present in the aortic position.  Sonographer:    Tinnie Barefoot RDCS Referring Phys: [id number] PHOEBE CHUN IMPRESSIONS  1. Left ventricular ejection fraction, by estimation, is 60 to 65%. The left ventricle has normal  function. The left ventricle has no regional wall motion abnormalities. There is mild concentric left ventricular hypertrophy. Left ventricular diastolic parameters are consistent with Grade I diastolic dysfunction (impaired relaxation).  2. Right ventricular systolic function is mildly reduced. The right ventricular size is normal.  3. Left atrial size was mildly dilated.  4. The mitral valve is normal in structure. No evidence of mitral valve regurgitation. No evidence of mitral stenosis. The mean mitral valve gradient is 13.0 mmHg. Severe mitral annular calcification.  5. The aortic valve is tricuspid. Aortic valve regurgitation is mild. Moderate aortic valve stenosis. There is a valve present in the aortic position. Aortic valve area, by VTI measures 0.96 cm. Aortic valve mean gradient measures 33.0 mmHg. Aortic valve Vmax measures 3.69 m/s.  6. The inferior vena cava is normal in size with greater than 50% respiratory variability, suggesting right atrial pressure of 3 mmHg. FINDINGS  Left Ventricle: Left ventricular ejection fraction, by estimation, is 60 to 65%. The left ventricle has normal function. The left ventricle has no regional wall motion abnormalities. The left ventricular internal cavity size was normal in size. There is  mild concentric left ventricular hypertrophy. Left ventricular diastolic parameters are consistent with Grade I diastolic dysfunction (impaired relaxation). Right Ventricle: The right ventricular size is normal. No increase in right ventricular wall thickness. Right ventricular systolic function is mildly reduced. Left Atrium: Left atrial size was mildly dilated. Right Atrium: Right atrial size was normal in size. Pericardium: There is no evidence of pericardial effusion. Mitral Valve: Elevated gradient more due to severe MAC versus true MS. MVA 2.3. The mitral valve is normal in structure. Severe mitral annular calcification.  No evidence of mitral valve regurgitation. No evidence  of mitral valve stenosis. MV peak gradient, 24.0 mmHg. The mean mitral valve gradient is 13.0 mmHg. Tricuspid Valve: The tricuspid valve is normal in structure. Tricuspid valve regurgitation is trivial. No evidence of tricuspid stenosis. Aortic Valve: DVI 0.3. Significant progression since previous echo. The aortic valve is tricuspid. Aortic valve regurgitation is mild. Moderate aortic stenosis is present. Aortic valve mean gradient measures 33.0 mmHg. Aortic valve peak gradient measures  54.5 mmHg. Aortic valve area, by VTI measures 0.96 cm. There is a valve present in the aortic position. Pulmonic Valve: The pulmonic valve was normal in structure. Pulmonic valve regurgitation is not visualized. No evidence of pulmonic stenosis. Aorta: The aortic root is normal in size and structure. Venous: The inferior vena cava is normal in size with greater than 50% respiratory variability, suggesting right atrial pressure of 3 mmHg. IAS/Shunts: No atrial level shunt detected by color flow Doppler.  LEFT VENTRICLE PLAX 2D LVIDd:         5.00 cm     Diastology LVIDs:         3.40 cm     LV e' medial:  6.09 cm/s LV PW:         1.10 cm     LV e' lateral: 6.96 cm/s LV IVS:        1.10 cm LVOT diam:     2.00 cm LV SV:         71 LV SV Index:   37 LVOT Area:     3.14 cm  LV Volumes (MOD) LV vol d, MOD A2C: 49.7 ml LV vol d, MOD A4C: 48.9 ml LV vol s, MOD A2C: 19.9 ml LV vol s, MOD A4C: 18.7 ml LV SV MOD A2C:     29.8 ml LV SV MOD A4C:     48.9 ml LV SV MOD BP:      29.6 ml RIGHT VENTRICLE             IVC RV Basal diam:  2.30 cm     IVC diam: 2.10 cm RV S prime:     10.00 cm/s TAPSE (M-mode): 2.0 cm LEFT ATRIUM             Index        RIGHT ATRIUM           Index LA diam:        4.80 cm 2.48 cm/m   RA Area:     13.00 cm LA Vol (A2C):   63.9 ml 33.08 ml/m  RA Volume:   28.40 ml  14.70 ml/m LA Vol (A4C):   62.4 ml 32.30 ml/m LA Biplane Vol: 68.0 ml 35.20 ml/m  AORTIC VALVE AV Area (Vmax):    0.93 cm AV Area (Vmean):   0.85 cm  AV Area (VTI):     0.96 cm AV Vmax:           369.00 cm/s AV Vmean:          278.000 cm/s AV VTI:            0.733 m AV Peak Grad:      54.5 mmHg AV Mean Grad:      33.0 mmHg LVOT Vmax:         109.50 cm/s LVOT Vmean:        74.900 cm/s LVOT VTI:          0.225 m LVOT/AV VTI ratio: 0.31  AORTA  Ao Root diam: 2.50 cm Ao Asc diam:  2.70 cm MITRAL VALVE MV Peak grad: 24.0 mmHg  SHUNTS MV Mean grad: 13.0 mmHg  Systemic VTI:  0.22 m MV Vmax:      2.45 m/s   Systemic Diam: 2.00 cm MV Vmean:     172.5 cm/s Morene Brownie Electronically signed by Morene Brownie Signature Date/Time: 09/22/2024/5:20:31 PM    Final    DG Chest Port 1 View Result Date: 09/22/2024 EXAM: 1 VIEW(S) XRAY OF THE CHEST 09/22/2024 11:55:17 AM COMPARISON: Chest x-ray 08/25/2021. CLINICAL HISTORY: Dyspnea. FINDINGS: LUNGS AND PLEURA: Diffuse interstitial prominence. Bibasilar patchy airspace and interstitial opacities. Blunting of bilateral costophrenic angles with possible trace pleural effusions. No pneumothorax. HEART AND MEDIASTINUM: Cardiomegaly. Atherosclerotic plaque noted. BONES AND SOFT TISSUES: No acute osseous abnormality. IMPRESSION: 1. Diffuse interstitial prominence and bibasilar patchy airspace and interstitial opacities. 2. Blunting of the bilateral costophrenic angles with trace pleural effusions. 3. Cardiomegaly. Electronically signed by: Morgane Naveau MD 09/22/2024 12:52 PM EST RP Workstation: [id number]    ASSESSMENT/PLAN:  Assessment: Principal Problem:   Acute on chronic hypoxic respiratory failure (HCC) Active Problems:   Aortic stenosis, mild   Benign essential hypertension   Paroxysmal atrial flutter (HCC)   Tobacco use disorder   Diabetes mellitus without complication (HCC)   Aortic stenosis   Leukocytosis   (HFpEF) heart failure with preserved ejection fraction (HCC)   COPD (chronic obstructive pulmonary disease) (HCC)   Makayla Leach is a 74 y.o. female with PMH of hypertension, paroxysmal A-fib,  peripheral vascular disease, type 2 diabetes mellitus, aortic stenosis, CHF EF 60 to 65% who presents today with shortness of breath that has been going on for the last 3 days and is being admitted for acute on chronic hypoxic respiratory failure and is being treated for acute on chronic heart failure exacerbation.  Plan: #Acute on chronic hypoxic respiratory failure  Breathing is better this morning on 4 L of oxygen.  She is off of BiPAP.  No respiratory distress.  Crackles heard bilaterally and having subcu edema in her abdomen.  Legs appear better today.  Put out around 2 L last night. - Given  IV Lasix  80 mg once today - Assess diuresis response and if on 2 to 3 L of oxygen can discharge later today -- incentive spirometer and flutter valve - oxygen saturation goal between 88-92%, wean as tolerated - diet once patient is off of BiPAP - duonebs q4 pRN  - Add on Pulmicort  and Brovana  nebs  #CHF EF 60 to 65% #elevated BNP As above.  Echo was unchanged except for moderate aortic valve stenosis.  We are treating her acute on chronic hypoxic respiratory failure as acute on chronic heart failure exacerbation -Continue Jardiance   #COPD No cough or productive sputum. No formal PFTs noted and patient is not taking any inhalers consistently.  Low concern for exacerbation although on the differential due to hypoxia.  - brovana , Pulmicort  nebulizer scheduled -Could benefit from formal PFTs as an outpatient   #tobacco use disorder - nicotine  7 patch daily. Smokes a pack a week currently but has smoking since 60-68 years old    #Paroxysmal atrial flutter Rate controlled at this time, do not seen on EKG  - continue apixaban  5 mg  - continue on flecainide  50 mg BID   #Prolonged QT 514 today up from 511. Monitoring as patient is on flecainide  - avoid QT prolonging meds    #aortic stenosis No acute concern. Heard on exam.  Changed from last echo from mild to moderate aortic stenosis.     #leukocytosis  Could be reactive. Has been noted in the past chronically. Has not endorsed any sick symptoms. Stable at 14.9 and is s/p IV steroids  - procal .27, but still low concern for infection. Improvement with diuresis.    #HLD  Atorvastatin  10 mg    #Type 2 DM  Not on anything at home.  - 6.8 A1c - continue jardiance   - watch fasting sugars   Best Practice: Diet: Cardiac diet IVF:  VTE: apixaban  5mg  BID  Code: DNR/DNI  Disposition planning: Therapy Recs: pending Family Contact: husband at bedside  DISPO: Anticipated discharge pending further diuresis and oxygen weaning   Signature:  Deniah Saia [facility]  11:31 AM, 09/23/2024  On Call pager [phone] "

## 2024-09-23 NOTE — Telephone Encounter (Signed)
 Pharmacy called the [place] [facility] is closed and lasix  called in as ordered

## 2024-09-23 NOTE — ED Notes (Signed)
 CCMD notified. Pt is on monitor.

## 2024-09-23 NOTE — ED Notes (Signed)
 Pt on 93% on 3L and does not report sob. This RN wheeled patient out by w/c to car. This RN went over the importance of oxygenation and medications and went over how to take medicaiton. Patient left accompanied by husband and on home 3L [place].

## 2024-09-23 NOTE — ED Notes (Signed)
 Assisted up to [facility] no change in resp effort after going back to bed.

## 2024-09-23 NOTE — Discharge Instructions (Addendum)
 You were hospitalized for having trouble breathing and we got a lot of fluid off of you with medications. We also did the ultrasound of your heart and found that one of your valves has stiffened more. Please let your heart doctor know about this . Thank you for allowing us  to be part of your care.   Please arrange hospital follow-up with:  Follow up with you primary care doctor and your heart doctor   Please note these changes made to your medications:   Please do not take your amlodipine - benazpril and your metoprolol  until you follow up with your doctors.Your blood pressure was on the lower side which is why we stopped these medications. Please try to monitor your blood pressure at home and if your blood pressure is greater than 140 as the top number you can take your amlodipine - benazpril   For your lasix : take 80 mg (increased from 60 mg) -On Saturday (1/3) night: take one 80 mg pill -On Sunday (1/4): Take one 80 mg pill in the morning and another 80 mg pill at night -On Monday(1/5): Take one 80 mg pill in the morning and another 80 mg at night -On Tuesday (1/6): start to take ONLY one 80 mg pill ONCE a day--> This pill will be the start to your pill pack from [facility] pharmacy   For the dates of 1/3-1/5: PLEASE USE THE bill bottle from [facility].   After Monday 1/5: You may take an additional 80 mg as NEEDED if you have increase abdominal swelling, shortness of breath, leg swelling, gain more than 3lbs in 24 hours or 5lbs in one week.   We ordered a new inhaler for your COPD: Use one puff Spiriva daily   For the Metoprolol  Succinate: If your blood pressure is lower than 100 for the top number or 60 for the bottom number (100/60): do NOT take metoprolol  that day. If you feel dizzy or lightheaded, also check your BP and if it is low: hold your metoprolol  that day. Contact your PCP or Cardiologist for additional questions.

## 2024-09-23 NOTE — Discharge Summary (Signed)
 "   Name: Makayla Leach MRN: [id number] DOB: 1951-06-22 74 y.o. PCP: [name] LABOR, MD  Date of Admission: 09/22/2024 10:48 AM Date of Discharge: 09/23/2024 Attending Physician: Dr. Jone Dauphin  Discharge Diagnosis: 1. Principal Problem:   Acute on chronic hypoxic respiratory failure (HCC) Active Problems:   Aortic stenosis, mild   Benign essential hypertension   Paroxysmal atrial flutter (HCC)   Tobacco use disorder   Diabetes mellitus without complication (HCC)   Aortic stenosis   Leukocytosis   (HFpEF) heart failure with preserved ejection fraction (HCC)   COPD (chronic obstructive pulmonary disease) (HCC)    Discharge Medications: Allergies as of 09/23/2024   No Known Allergies      Medication List     PAUSE taking these medications    amLODipine -benazepril  10-20 MG capsule Wait to take this until your doctor or other care provider tells you to start again. Commonly known as: Lotrel Take 1 capsule by mouth daily.       TAKE these medications    albuterol  0.63 MG/3ML nebulizer solution Commonly known as: ACCUNEB  Take 0.63 mg by nebulization every 6 (six) hours as needed for wheezing.   albuterol  108 (90 Base) MCG/ACT inhaler Commonly known as: VENTOLIN  HFA Inhale 2 puffs into the lungs every 6 (six) hours as needed for wheezing or shortness of breath.   atorvastatin  10 MG tablet Commonly known as: LIPITOR Take 1 tablet (10 mg total) by mouth every evening. FOR CHOLESTEROL   Eliquis  5 MG Tabs tablet Generic drug: apixaban  TAKE ONE TABLET TWICE DAILY   flecainide  50 MG tablet Commonly known as: TAMBOCOR  TAKE ONE TABLET BY MOUTH TWICE DAILY   furosemide  80 MG tablet Commonly known as: Lasix  Take 1 tablet (80 mg total) by mouth 2 (two) times daily. What changed: You were already taking a medication with the same name, and this prescription was added. Make sure you understand how and when to take each.   furosemide  80 MG tablet Commonly known as:  Lasix  Take 1 tablet (80 mg total) by mouth daily. Start taking on: September 26, 2024 What changed:  medication strength how much to take when to take this These instructions start on September 26, 2024. If you are unsure what to do until then, ask your doctor or other care provider.   furosemide  80 MG tablet Commonly known as: Lasix  Take 1 tablet (80 mg total) by mouth daily as needed. Please weigh yourself everyday. If your weight goes up by more than 3lbs in one day or 5 lb in one week, or you're getting more trouble breathing then take one of these tablets in addition to 80 mg a day Start taking on: September 26, 2024 What changed: You were already taking a medication with the same name, and this prescription was added. Make sure you understand how and when to take each.   Jardiance  10 MG Tabs tablet Generic drug: empagliflozin  Take 10 mg by mouth daily.   metoprolol  succinate 50 MG 24 hr tablet Commonly known as: TOPROL -XL Take 50 mg by mouth 2 (two) times daily.   nicotine  7 mg/24hr patch Commonly known as: NICODERM CQ  - dosed in mg/24 hr Place 1 patch (7 mg total) onto the skin daily. Start taking on: September 24, 2024   potassium chloride  10 MEQ tablet Commonly known as: KLOR-CON  TAKE ONE TABLET BY MOUTH EVERY MORNING   Tiotropium Bromide  18 MCG Caps Commonly known as: Spiriva HandiHaler Place 18 mcg into inhaler and inhale daily.  Disposition and follow-up:   [name] was discharged from [facility] in Stable condition.  At the hospital follow up visit please address:  1.  Acute on Chronic Systolic Heart Failure  - New echo showed moderate aortic stenosis - lasix  dose changed to 80 daily with an 80 daily prn pending weight changes.  -sent home taking 80 twice  a day until Monday 1/5  - PRN instructions are 3lbs in a day, or 5lbs in a week as far as weight, can also give for abdominal swelling and SOB.   Metoprolol  succinate instructions-  only take for BP higher than 100/60. Patient reported low Bps  Held amlodipine  benazpril with lower blood pressures. Can consider resuming outside of the hospital   COPD- could benefit from PFTs Spiriva added daily   2.  Labs / imaging needed at time of follow-up: BMP  3.  Pending labs/ test needing follow-up: none  Follow-up Appointments:    Hospital Course by problem list: [name] is a 74 y.o. female with PMH of hypertension, paroxysmal A-fib, peripheral vascular disease, type 2 diabetes mellitus, aortic stenosis, CHF EF 60 to 65% who presents today with shortness of breath that has been going on for the last 3 days and is being admitted for acute on chronic hypoxic respiratory failure and is being treated for acute on chronic heart failure exacerbation   #Acute on chronic hypoxic respiratory failure  Patient is baseline on oxygen at 2L but came in on 3L at 70%.  Gradual over the last 3 days.  BNP elevated and bibasilar crackles and edema in her abdomen believe this is likely due to HFpEF exacerbation.  Was given IV diuresis and wean down on oxygen to goal of 88 to 92%.  Also given Pulmicort  and Brovana  nebs scheduled.  Updated echo was unchanged except now moderate aortic valve stenosis.   #CHF EF 60 to 65% #elevated BNP Echo ordered at this visit and showed moderate aortic valve stenosis.  Otherwise unchanged.  Was treated as heart failure exacerbation in the hospital.  Continue Jardiance   #tobacco use disorder -nicotine  7 patch daily. Smokes a pack a week currently but has smoking since [age] years old    #Paroxysmal atrial flutter Rate controlled at this time, NSR on EKG. today on flecainide  and apixaban    #aortic stenosis Moderate aortic valve stenosis.  No acute concern for hospitalization will need follow-up with cardiology   #leukocytosis  Stable.  Could be reactive.  Patient also received IV steroids.  Pro-Cal was 0.27 but given clinical picture and response  improvement diuresis still held on antibiotics.   #HLD  Atorvastatin  10 mg    #Type 2 DM  6.8. No SSI at this time    Discharge Subjective: Patient is eager to go home today.Patient states that her breathing is a lot better and she is eating her breakfast at the time of evaluation.      Discharge Exam:   BP (!) 122/57 (BP Location: Left Arm)   Pulse 79   Temp (!) 97.5 F (36.4 C)   Resp (!) 27   LMP  (LMP Unknown)   SpO2 93%  Discharge exam:  Physical Exam Constitutional:      General: She is not in acute distress. Cardiovascular:     Heart sounds: Murmur heard.  Pulmonary:     Effort: Pulmonary effort is normal. No respiratory distress.     Comments: Crackles in bilateral lung bases  Abdominal:  Tenderness: There is no abdominal tenderness.     Comments: Edematous abdomen, fluid wave appreciated   Musculoskeletal:     Comments: Mild edema noted bilaterally but more wrinkles on skin indicating some diuresis   Skin:    General: Skin is warm.  Neurological:     Mental Status: She is alert and oriented to person, place, and time.     Pertinent Labs, Studies, and Procedures:     Latest Ref Rng & Units 09/23/2024    4:18 AM 09/22/2024    6:03 PM 09/22/2024   11:26 AM  CBC  WBC 4.0 - 10.5 K/uL 14.9     Hemoglobin 12.0 - 15.0 g/dL 82.9  81.2  80.2   Hematocrit 36.0 - 46.0 % 52.4  55.0  58.0   Platelets 150 - 400 K/uL 203          Latest Ref Rng & Units 09/23/2024    4:18 AM 09/22/2024    6:03 PM 09/22/2024   11:26 AM  CMP  Glucose 70 - 99 mg/dL 814     BUN 8 - 23 mg/dL 30     Creatinine 9.55 - 1.00 mg/dL 8.92     Sodium 864 - 854 mmol/L 139  143  142   Potassium 3.5 - 5.1 mmol/L 4.4  4.3  4.3   Chloride 98 - 111 mmol/L 103     CO2 22 - 32 mmol/L 24     Calcium  8.9 - 10.3 mg/dL 9.1       ECHOCARDIOGRAM COMPLETE Result Date: 09/22/2024    ECHOCARDIOGRAM REPORT   Patient Name:   JULIZZA SASSONE Date of Exam: 09/22/2024 Medical Rec #:  [id number]       Height:        62.0 in Accession #:    [id number]      Weight:       205.0 lb Date of Birth:  [birth year]       BSA:          1.932 m Patient Age:    73 years        BP:           112/42 mmHg Patient Gender: F               HR:           85 bpm. Exam Location:  Inpatient Procedure: 2D Echo (Both Spectral and Color Flow Doppler were utilized during            procedure). Indications:    acute diastolic congestive heart failure  History:        Patient has prior history of Echocardiogram examinations, most                 recent 03/29/2023. COPD, Aortic Valve Disease and Mitral Valve                 Disease; Risk Factors:Hypertension, Current Smoker and Diabetes.                 Aortic Valve: valve is present in the aortic position.  Sonographer:    Tinnie Barefoot RDCS Referring Phys: [id number] PHOEBE CHUN IMPRESSIONS  1. Left ventricular ejection fraction, by estimation, is 60 to 65%. The left ventricle has normal function. The left ventricle has no regional wall motion abnormalities. There is mild concentric left ventricular hypertrophy. Left ventricular diastolic parameters are consistent with Grade I diastolic dysfunction (impaired relaxation).  2. Right ventricular systolic function  is mildly reduced. The right ventricular size is normal.  3. Left atrial size was mildly dilated.  4. The mitral valve is normal in structure. No evidence of mitral valve regurgitation. No evidence of mitral stenosis. The mean mitral valve gradient is 13.0 mmHg. Severe mitral annular calcification.  5. The aortic valve is tricuspid. Aortic valve regurgitation is mild. Moderate aortic valve stenosis. There is a valve present in the aortic position. Aortic valve area, by VTI measures 0.96 cm. Aortic valve mean gradient measures 33.0 mmHg. Aortic valve Vmax measures 3.69 m/s.  6. The inferior vena cava is normal in size with greater than 50% respiratory variability, suggesting right atrial pressure of 3 mmHg. FINDINGS  Left Ventricle: Left ventricular  ejection fraction, by estimation, is 60 to 65%. The left ventricle has normal function. The left ventricle has no regional wall motion abnormalities. The left ventricular internal cavity size was normal in size. There is  mild concentric left ventricular hypertrophy. Left ventricular diastolic parameters are consistent with Grade I diastolic dysfunction (impaired relaxation). Right Ventricle: The right ventricular size is normal. No increase in right ventricular wall thickness. Right ventricular systolic function is mildly reduced. Left Atrium: Left atrial size was mildly dilated. Right Atrium: Right atrial size was normal in size. Pericardium: There is no evidence of pericardial effusion. Mitral Valve: Elevated gradient more due to severe MAC versus true MS. MVA 2.3. The mitral valve is normal in structure. Severe mitral annular calcification. No evidence of mitral valve regurgitation. No evidence of mitral valve stenosis. MV peak gradient, 24.0 mmHg. The mean mitral valve gradient is 13.0 mmHg. Tricuspid Valve: The tricuspid valve is normal in structure. Tricuspid valve regurgitation is trivial. No evidence of tricuspid stenosis. Aortic Valve: DVI 0.3. Significant progression since previous echo. The aortic valve is tricuspid. Aortic valve regurgitation is mild. Moderate aortic stenosis is present. Aortic valve mean gradient measures 33.0 mmHg. Aortic valve peak gradient measures  54.5 mmHg. Aortic valve area, by VTI measures 0.96 cm. There is a valve present in the aortic position. Pulmonic Valve: The pulmonic valve was normal in structure. Pulmonic valve regurgitation is not visualized. No evidence of pulmonic stenosis. Aorta: The aortic root is normal in size and structure. Venous: The inferior vena cava is normal in size with greater than 50% respiratory variability, suggesting right atrial pressure of 3 mmHg. IAS/Shunts: No atrial level shunt detected by color flow Doppler.  LEFT VENTRICLE PLAX 2D LVIDd:          5.00 cm     Diastology LVIDs:         3.40 cm     LV e' medial:  6.09 cm/s LV PW:         1.10 cm     LV e' lateral: 6.96 cm/s LV IVS:        1.10 cm LVOT diam:     2.00 cm LV SV:         71 LV SV Index:   37 LVOT Area:     3.14 cm  LV Volumes (MOD) LV vol d, MOD A2C: 49.7 ml LV vol d, MOD A4C: 48.9 ml LV vol s, MOD A2C: 19.9 ml LV vol s, MOD A4C: 18.7 ml LV SV MOD A2C:     29.8 ml LV SV MOD A4C:     48.9 ml LV SV MOD BP:      29.6 ml RIGHT VENTRICLE             IVC RV  Basal diam:  2.30 cm     IVC diam: 2.10 cm RV S prime:     10.00 cm/s TAPSE (M-mode): 2.0 cm LEFT ATRIUM             Index        RIGHT ATRIUM           Index LA diam:        4.80 cm 2.48 cm/m   RA Area:     13.00 cm LA Vol (A2C):   63.9 ml 33.08 ml/m  RA Volume:   28.40 ml  14.70 ml/m LA Vol (A4C):   62.4 ml 32.30 ml/m LA Biplane Vol: 68.0 ml 35.20 ml/m  AORTIC VALVE AV Area (Vmax):    0.93 cm AV Area (Vmean):   0.85 cm AV Area (VTI):     0.96 cm AV Vmax:           369.00 cm/s AV Vmean:          278.000 cm/s AV VTI:            0.733 m AV Peak Grad:      54.5 mmHg AV Mean Grad:      33.0 mmHg LVOT Vmax:         109.50 cm/s LVOT Vmean:        74.900 cm/s LVOT VTI:          0.225 m LVOT/AV VTI ratio: 0.31  AORTA Ao Root diam: 2.50 cm Ao Asc diam:  2.70 cm MITRAL VALVE MV Peak grad: 24.0 mmHg  SHUNTS MV Mean grad: 13.0 mmHg  Systemic VTI:  0.22 m MV Vmax:      2.45 m/s   Systemic Diam: 2.00 cm MV Vmean:     172.5 cm/s Morene Brownie Electronically signed by Morene Brownie Signature Date/Time: 09/22/2024/5:20:31 PM    Final    DG Chest Port 1 View Result Date: 09/22/2024 EXAM: 1 VIEW(S) XRAY OF THE CHEST 09/22/2024 11:55:17 AM COMPARISON: Chest x-ray 08/25/2021. CLINICAL HISTORY: Dyspnea. FINDINGS: LUNGS AND PLEURA: Diffuse interstitial prominence. Bibasilar patchy airspace and interstitial opacities. Blunting of bilateral costophrenic angles with possible trace pleural effusions. No pneumothorax. HEART AND MEDIASTINUM: Cardiomegaly.  Atherosclerotic plaque noted. BONES AND SOFT TISSUES: No acute osseous abnormality. IMPRESSION: 1. Diffuse interstitial prominence and bibasilar patchy airspace and interstitial opacities. 2. Blunting of the bilateral costophrenic angles with trace pleural effusions. 3. Cardiomegaly. Electronically signed by: Morgane Naveau MD 09/22/2024 12:52 PM EST RP Workstation: [id number]     Discharge Instructions: Discharge Instructions     (HEART FAILURE PATIENTS) Call MD:  Anytime you have any of the following symptoms: 1) 3 pound weight gain in 24 hours or 5 pounds in 1 week 2) shortness of breath, with or without a dry hacking cough 3) swelling in the hands, feet or stomach 4) if you have to sleep on extra pillows at night in order to breathe.   Complete by: As directed    (HEART FAILURE PATIENTS) Call MD:  Anytime you have any of the following symptoms: 1) 3 pound weight gain in 24 hours or 5 pounds in 1 week 2) shortness of breath, with or without a dry hacking cough 3) swelling in the hands, feet or stomach 4) if you have to sleep on extra pillows at night in order to breathe.   Complete by: As directed    Call MD for:   Complete by: As directed    Call MD for:  difficulty breathing, headache or  visual disturbances   Complete by: As directed    Call MD for:  difficulty breathing, headache or visual disturbances   Complete by: As directed    Call MD for:  extreme fatigue   Complete by: As directed    Call MD for:  extreme fatigue   Complete by: As directed    Call MD for:  hives   Complete by: As directed    Call MD for:  hives   Complete by: As directed    Call MD for:  persistant dizziness or light-headedness   Complete by: As directed    Call MD for:  persistant dizziness or light-headedness   Complete by: As directed    Call MD for:  persistant nausea and vomiting   Complete by: As directed    Call MD for:  persistant nausea and vomiting   Complete by: As directed    Call MD for:   redness, tenderness, or signs of infection (pain, swelling, redness, odor or green/yellow discharge around incision site)   Complete by: As directed    Call MD for:  redness, tenderness, or signs of infection (pain, swelling, redness, odor or green/yellow discharge around incision site)   Complete by: As directed    Call MD for:  severe uncontrolled pain   Complete by: As directed    Call MD for:  severe uncontrolled pain   Complete by: As directed    Call MD for:  temperature >100.4   Complete by: As directed    Call MD for:  temperature >100.4   Complete by: As directed    Discharge instructions   Complete by: As directed    You were hospitalized for having trouble breathing and we got a lot of fluid off of you with medications. We also did the ultrasound of your heart and found that one of your valves has stiffened more. Please let your heart doctor know about this . Thank you for allowing us  to be part of your care.   Please arrange hospital follow-up with:  Follow up with you primary care doctor and your heart doctor   Please note these changes made to your medications:   Please do not take your amlodipine - benazpril and your metoprolol  until you follow up with your doctors.Your blood pressure was on the lower side which is why we stopped these medications. Please try to monitor your blood pressure at home and if your blood pressure is greater than 140 as the top number you can take your amlodipine - benazpril   For your lasix : take 80 mg (increased from 60 mg) -On Saturday (1/3) night: take one 80 mg pill -On Sunday (1/4): Take one 80 mg pill in the morning and another 80 mg pill at night -On Monday(1/5): Take one 80 mg pill in the morning and another 80 mg at night -On Tuesday (1/6): start to take ONLY one 80 mg pill ONCE a day--> This pill will be the start to your pill pack from [facility] pharmacy   For the dates of 1/3-1/5: PLEASE USE THE bill bottle from [facility].   After  Monday 1/5: You may take an additional 80 mg as NEEDED if you have increase abdominal swelling, shortness of breath, leg swelling, gain more than 3lbs in 24 hours or 5lbs in one week.   We ordered a new inhaler for your COPD: Use one puff Spiriva daily   For the Metoprolol  Succinate: If your blood pressure is lower than 100 for  the top number or 60 for the bottom number (100/60): do NOT take metoprolol  that day. If you feel dizzy or lightheaded, also check your BP and if it is low: hold your metoprolol  that day. Contact your PCP or Cardiologist for additional questions.   Discharge instructions   Complete by: As directed    You were hospitalized for having trouble breathing and we got a lot of fluid off of you with medications. We also did the ultrasound of your heart and found that one of your valves has stiffened more. Please let your heart doctor know about this . Thank you for allowing us  to be part of your care.   Please arrange hospital follow-up with:  Follow up with you primary care doctor and your heart doctor   Please note these changes made to your medications:   Please do not take your amlodipine - benazpril and your metoprolol  until you follow up with your doctors.Your blood pressure was on the lower side which is why we stopped these medications. Please try to monitor your blood pressure at home and if your blood pressure is greater than 140 as the top number you can take your amlodipine - benazpril   For your lasix : take 80 mg (increased from 60 mg) -On Saturday (1/3) night: take one 80 mg pill -On Sunday (1/4): Take one 80 mg pill in the morning and another 80 mg pill at night -On Monday(1/5): Take one 80 mg pill in the morning and another 80 mg at night -On Tuesday (1/6): start to take ONLY one 80 mg pill ONCE a day--> This pill will be the start to your pill pack from [facility] pharmacy   For the dates of 1/3-1/5: PLEASE USE THE bill bottle from [facility].   After Monday  1/5: You may take an additional 80 mg as NEEDED if you have increase abdominal swelling, shortness of breath, leg swelling, gain more than 3lbs in 24 hours or 5lbs in one week.   We ordered a new inhaler for your COPD: Use one puff Spiriva daily   For the Metoprolol  Succinate: If your blood pressure is lower than 100 for the top number or 60 for the bottom number (100/60): do NOT take metoprolol  that day. If you feel dizzy or lightheaded, also check your BP and if it is low: hold your metoprolol  that day. Contact your PCP or Cardiologist for additional questions.   Increase activity slowly   Complete by: As directed    Increase activity slowly   Complete by: As directed        Signed: D'Mello, Terryon Pineiro, DO 09/23/2024, 1:54 PM     "

## 2024-10-04 NOTE — Progress Notes (Signed)
 Acute on chronic diastolic heart failure

## 2024-10-11 ENCOUNTER — Telehealth: Payer: Self-pay | Admitting: Cardiology

## 2024-10-11 NOTE — Telephone Encounter (Signed)
 Pt c/o medication issue:  1. Name of Medication:   amLODipine -benazepril  (LOTREL) 10-20 MG capsule (Paused)    2. How are you currently taking this medication (dosage and times per day)? Not sure   3. Are you having a reaction (difficulty breathing--STAT)? No   4. What is your medication issue? Pharmacy calling to discuss pts amlodipine  prescription. Please advise.

## 2024-10-11 NOTE — Telephone Encounter (Signed)
 Spoke with Chiquita at [facility]. Chiquita was requesting to know it the pt needed to resume her Lotrel that was held at her hospital admission. Chiquita was advised that she was supposed to see her provider with BP readings to discuss. Chiquita states she is in a program with them that does remote BP readings and will send them over for us  to review and advise on Lotrel.

## 2024-10-12 ENCOUNTER — Other Ambulatory Visit: Payer: Self-pay | Admitting: Student

## 2024-10-12 NOTE — Telephone Encounter (Signed)
 Not [facility] patient

## 2024-10-13 NOTE — Telephone Encounter (Signed)
 Chiquita calling to follow up regarding pt BP readings. Please advise

## 2024-10-13 NOTE — Telephone Encounter (Signed)
 BP readings received from [facility]. Will have Dr. Bernie review and call with Lotrel instructions.

## 2024-10-17 NOTE — Telephone Encounter (Signed)
 Spoke with pharmacy to not restart Lotrel and keep checking blood pressures per Dr. Krasowski.

## 2024-10-18 ENCOUNTER — Other Ambulatory Visit: Payer: Self-pay | Admitting: Cardiology

## 2024-10-20 ENCOUNTER — Telehealth: Payer: Self-pay | Admitting: Cardiology

## 2024-10-20 NOTE — Telephone Encounter (Signed)
" °*  STAT* If patient is at the pharmacy, call can be transferred to refill team.   1. Which medications need to be refilled? (please list name of each medication and dose if known)  furosemide  (LASIX ) 80 MG tablet  2. Which pharmacy/location (including street and city if local pharmacy) is medication to be sent to? [facility] [place] - [address]   3. Do they need a 30 day or 90 day supply? 30 day  "

## 2024-10-23 MED ORDER — FUROSEMIDE 80 MG PO TABS: 80.0000 mg | ORAL_TABLET | Freq: Every day | ORAL | 3 refills | Status: AC | PRN

## 2024-10-23 NOTE — Telephone Encounter (Signed)
 Refill sent
